# Patient Record
Sex: Female | Born: 1985
Health system: Southern US, Community
[De-identification: ages and names within clinical notes are randomized; demographics above are authoritative.]

## PROBLEM LIST (undated history)

## (undated) DIAGNOSIS — K589 Irritable bowel syndrome without diarrhea: Secondary | ICD-10-CM

## (undated) DIAGNOSIS — J45909 Unspecified asthma, uncomplicated: Secondary | ICD-10-CM

## (undated) HISTORY — DX: Unspecified asthma, uncomplicated: J45.909

## (undated) HISTORY — PX: MOUTH SURGERY: SHX715

## (undated) HISTORY — DX: Irritable bowel syndrome, unspecified: K58.9

---

## 2011-12-28 ENCOUNTER — Emergency Department: Payer: Self-pay | Admitting: Emergency Medicine

## 2011-12-28 LAB — COMPREHENSIVE METABOLIC PANEL WITH GFR
Albumin: 3.7 g/dL
Alkaline Phosphatase: 87 U/L
Anion Gap: 10
BUN: 10 mg/dL
Bilirubin,Total: 0.3 mg/dL
Calcium, Total: 8.9 mg/dL
Chloride: 109 mmol/L — ABNORMAL HIGH
Co2: 22 mmol/L
Creatinine: 0.73 mg/dL
EGFR (African American): >60
EGFR (Non-African Amer.): >60
Glucose: 98 mg/dL
Osmolality: 280
Potassium: 3.8 mmol/L
SGOT(AST): 42 U/L — ABNORMAL HIGH
SGPT (ALT): 24 U/L
Sodium: 141 mmol/L
Total Protein: 7.9 g/dL

## 2011-12-28 LAB — CBC
MCH: 32.1 pg (ref 26.0–34.0)
MCHC: 35 g/dL (ref 32.0–36.0)
Platelet: 301 10*3/uL (ref 150–440)
RBC: 4.37 10*6/uL (ref 3.80–5.20)
WBC: 12.5 10*3/uL — ABNORMAL HIGH (ref 3.6–11.0)

## 2011-12-28 LAB — URINALYSIS, COMPLETE
Bacteria: NONE SEEN
Bilirubin,UR: NEGATIVE
Blood: NEGATIVE
Glucose,UR: NEGATIVE mg/dL (ref 0–75)
Ph: 6 (ref 4.5–8.0)
Specific Gravity: 1.019 (ref 1.003–1.030)
Squamous Epithelial: 7
WBC UR: 2 /HPF (ref 0–5)

## 2011-12-28 LAB — PREGNANCY, URINE: Pregnancy Test, Urine: NEGATIVE m[IU]/mL

## 2012-10-16 ENCOUNTER — Encounter: Payer: Self-pay | Admitting: Family Medicine

## 2012-10-16 ENCOUNTER — Ambulatory Visit (INDEPENDENT_AMBULATORY_CARE_PROVIDER_SITE_OTHER): Payer: BC Managed Care – PPO | Admitting: Family Medicine

## 2012-10-16 VITALS — BP 118/94 | Temp 99.3°F | Ht 65.0 in | Wt 186.6 lb

## 2012-10-16 DIAGNOSIS — J309 Allergic rhinitis, unspecified: Secondary | ICD-10-CM

## 2012-10-16 DIAGNOSIS — K589 Irritable bowel syndrome without diarrhea: Secondary | ICD-10-CM | POA: Insufficient documentation

## 2012-10-16 DIAGNOSIS — J019 Acute sinusitis, unspecified: Secondary | ICD-10-CM

## 2012-10-16 MED ORDER — AMOXICILLIN 500 MG PO TABS: 500.0000 mg | ORAL_TABLET | Freq: Three times a day (TID) | ORAL | Status: DC

## 2012-10-16 MED ORDER — FLUTICASONE PROPIONATE 50 MCG/ACT NA SUSP: 2.0000 | Freq: Every day | NASAL | Status: DC

## 2012-10-16 NOTE — Progress Notes (Signed)
  Subjective:    Patient ID: Sherry Rogers, female    DOB: 09/05/1985, 27 y.o.   MRN: [id number]  Otalgia  There is pain in both ears. This is a new problem. The current episode started 1 to 4 weeks ago. The problem has been gradually worsening. Associated symptoms include coughing and headaches.   she has history irritable bowel she keeps it under control through diet she also has history of allergies occasionally uses Allegra    Review of Systems  HENT: Positive for ear pain.   Respiratory: Positive for cough.   Neurological: Positive for headaches.   patient does have a history of allergies. She denies any chest tightness pressure pain shortness breath vomiting diarrhea     Objective:   Physical Exam Eardrums normal sinus minimal tenderness nares boggy throat is normal neck supple lungs are clear hearts regular       Assessment & Plan:  #1 allergic rhinitis-over-the-counter allergy medicine daily Flonase 2 sprays each nostril daily #2 otalgia probably related to sinus pressure hopefully getting allergies doing better will relieve this #3 if progressive symptoms then get prescription for antibiotics filled

## 2012-11-27 ENCOUNTER — Ambulatory Visit: Payer: BC Managed Care – PPO | Admitting: Family Medicine

## 2012-11-27 ENCOUNTER — Telehealth: Payer: Self-pay | Admitting: Family Medicine

## 2012-11-27 MED ORDER — CIPROFLOXACIN HCL 250 MG PO TABS: 250.0000 mg | ORAL_TABLET | Freq: Two times a day (BID) | ORAL | Status: DC

## 2012-11-27 NOTE — Telephone Encounter (Signed)
Pt is home with husband who just had his appendix out on Wednesday. She feels she had a bladder infection but can't leave him. Can we call her in some antibiotics? [facility] in [place]   syptoms are: burning while urination, urgency but can't go, pressure, no fever present

## 2012-11-27 NOTE — Telephone Encounter (Signed)
cipro 250 bid five d 

## 2012-11-27 NOTE — Telephone Encounter (Signed)
Sent rx for cipro 250 bid five days to pharmacy. Left message on voicemail notifying patient.

## 2013-02-08 ENCOUNTER — Ambulatory Visit (INDEPENDENT_AMBULATORY_CARE_PROVIDER_SITE_OTHER): Payer: BC Managed Care – PPO | Admitting: Family Medicine

## 2013-02-08 ENCOUNTER — Encounter: Payer: Self-pay | Admitting: Family Medicine

## 2013-02-08 ENCOUNTER — Ambulatory Visit (HOSPITAL_COMMUNITY)
Admission: RE | Admit: 2013-02-08 | Discharge: 2013-02-08 | Disposition: A | Discharge status: Home / Self Care | Payer: BC Managed Care – PPO | Source: Ambulatory Visit | Attending: Family Medicine | Admitting: Family Medicine

## 2013-02-08 VITALS — BP 128/98 | Ht 65.0 in | Wt 176.0 lb

## 2013-02-08 DIAGNOSIS — M25579 Pain in unspecified ankle and joints of unspecified foot: Secondary | ICD-10-CM

## 2013-02-08 DIAGNOSIS — M25571 Pain in right ankle and joints of right foot: Secondary | ICD-10-CM

## 2013-02-08 MED ORDER — MELOXICAM 15 MG PO TABS: 15.0000 mg | ORAL_TABLET | Freq: Every day | ORAL | Status: DC

## 2013-02-08 NOTE — Progress Notes (Signed)
   Subjective:    Patient ID: Sherry Rogers, female    DOB: 16-Sep-1985, 27 y.o.   MRN: [id number]  HPI Comments: Larey Seat down stairs, 2 different times   Ankle Injury  The incident occurred more than 1 week ago. The incident occurred at home. The injury mechanism was a fall. The pain is present in the left ankle and right ankle. The quality of the pain is described as aching. The pain has been intermittent since onset. She reports no foreign bodies present. The symptoms are aggravated by movement and weight bearing. She has tried NSAIDs for the symptoms. The treatment provided mild relief.    Patient fell several months back worse on the right ankle than the left ankle PMH benign  Review of Systems No particular problems on today's discussion no chest pain shortness breath nausea vomiting    Objective:   Physical Exam Normal ankle moderate tenderness right side minimal left side fair range of motion knees are normal  Patient has lost 15 pounds through [employer]     Assessment & Plan:  Bilateral ankle pain worse on the right than the left related to previous falls-stretching exercises shown strengthening exercises shown in addition to this to do x-ray. Await the results

## 2013-03-23 ENCOUNTER — Ambulatory Visit (INDEPENDENT_AMBULATORY_CARE_PROVIDER_SITE_OTHER): Payer: BC Managed Care – PPO | Admitting: Family Medicine

## 2013-03-23 ENCOUNTER — Encounter: Payer: Self-pay | Admitting: Family Medicine

## 2013-03-23 VITALS — BP 138/82 | Temp 98.6°F | Ht 65.0 in | Wt 170.0 lb

## 2013-03-23 DIAGNOSIS — R300 Dysuria: Secondary | ICD-10-CM

## 2013-03-23 DIAGNOSIS — H9209 Otalgia, unspecified ear: Secondary | ICD-10-CM

## 2013-03-23 DIAGNOSIS — H9202 Otalgia, left ear: Secondary | ICD-10-CM

## 2013-03-23 LAB — POCT URINALYSIS DIPSTICK
Spec Grav, UA: 1.015
pH, UA: 5

## 2013-03-23 MED ORDER — CEFPROZIL 500 MG PO TABS: 500.0000 mg | ORAL_TABLET | Freq: Two times a day (BID) | ORAL | Status: DC

## 2013-03-23 NOTE — Progress Notes (Signed)
   Subjective:    Patient ID: Sherry Rogers, female    DOB: 09-Oct-1985, 28 y.o.   MRN: [id number]  HPI Patient arrives with urinary sx for few days- feels pressure to go but cant and painful urination. Denies fever sweats chills nausea diarrhea. Denies frequency of urinary tract infections Review of Systems    see above Objective:   Physical Exam  Lungs clear hearts regular abdomen soft UA with some wbc's Left ear appears normal may have some sinus issues     Assessment & Plan:  Dysuria - Plan: POCT urinalysis dipstick  Otalgia of left ear  Antibiotics prescribed followup if ongoing troubles

## 2013-06-22 ENCOUNTER — Ambulatory Visit: Payer: BC Managed Care – PPO | Admitting: Podiatry

## 2013-06-23 ENCOUNTER — Ambulatory Visit (INDEPENDENT_AMBULATORY_CARE_PROVIDER_SITE_OTHER): Payer: BC Managed Care – PPO

## 2013-06-23 ENCOUNTER — Telehealth: Payer: Self-pay | Admitting: *Deleted

## 2013-06-23 VITALS — BP 157/90 | HR 91 | Resp 17 | Ht 65.0 in | Wt 158.0 lb

## 2013-06-23 DIAGNOSIS — B351 Tinea unguium: Secondary | ICD-10-CM

## 2013-06-23 DIAGNOSIS — S90129A Contusion of unspecified lesser toe(s) without damage to nail, initial encounter: Secondary | ICD-10-CM

## 2013-06-23 MED ORDER — EFINACONAZOLE 10 % EX SOLN: CUTANEOUS | Status: DC

## 2013-06-23 NOTE — Patient Instructions (Signed)
Onychomycosis/Fungal Toenails  WHAT IS IT? An infection that lies within the keratin of your nail plate that is caused by a fungus.  WHY ME? Fungal infections affect all ages, sexes, races, and creeds.  There may be many factors that predispose you to a fungal infection such as age, coexisting medical conditions such as diabetes, or an autoimmune disease; stress, medications, fatigue, genetics, etc.  Bottom line: fungus thrives in a warm, moist environment and your shoes offer such a location.  IS IT CONTAGIOUS? Theoretically, yes.  You do not want to share shoes, nail clippers or files with someone who has fungal toenails.  Walking around barefoot in the same room or sleeping in the same bed is unlikely to transfer the organism.  It is important to realize, however, that fungus can spread easily from one nail to the next on the same foot.  HOW DO WE TREAT THIS?  There are several ways to treat this condition.  Treatment may depend on many factors such as age, medications, pregnancy, liver and kidney conditions, etc.  It is best to ask your doctor which options are available to you.  1. No treatment.   Unlike many other medical concerns, you can live with this condition.  However for many people this can be a painful condition and may lead to ingrown toenails or a bacterial infection.  It is recommended that you keep the nails cut short to help reduce the amount of fungal nail. 2. Topical treatment.  These range from herbal remedies to prescription strength nail lacquers.  About 40-50% effective, topicals require twice daily application for approximately 9 to 12 months or until an entirely new nail has grown out.  The most effective topicals are medical grade medications available through physicians offices. 3. Oral antifungal medications.  With an 80-90% cure rate, the most common oral medication requires 3 to 4 months of therapy and stays in your system for a year as the new nail grows out.  Oral  antifungal medications do require blood work to make sure it is a safe drug for you.  A liver function panel will be performed prior to starting the medication and after the first month of treatment.  It is important to have the blood work performed to avoid any harmful side effects.  In general, this medication safe but blood work is required. 4. Laser Therapy.  This treatment is performed by applying a specialized laser to the affected nail plate.  This therapy is noninvasive, fast, and non-painful.  It is not covered by insurance and is therefore, out of pocket.  The results have been very good with a 80-95% cure rate.  [facility] is the only practice in the area to offer this therapy. 5. Permanent Nail Avulsion.  Removing the entire nail so that a new nail will not grow back.  Apply prescribed topical nail antifungal once daily as instructed for 12 months to the affected  Reappointed 6 weeks for followup and possible initiation of oral antifungal medications

## 2013-06-23 NOTE — Progress Notes (Signed)
   Subjective:    Patient ID: Sherry Rogers, female    DOB: 1985/09/11, 28 y.o.   MRN: [id number]  HPI Comments: N toenail fungus L left 1st toenail D 12 years O dropped a desk in the toe in the 9th grade C thick, discolored, and tender A pressure, enclosed shoes T hx of surgical removal x2 and fell off once    And use of antifungal cream and gel    Review of Systems  Allergic/Immunologic: Positive for environmental allergies.  All other systems reviewed and are negative.      Objective:   Physical Exam Neurovascular status is intact pedal pulses palpable epicritic and proprioceptive sensations intact and symmetric bilateral. There is normal plantar response DTRs not elicited dermatologically skin color pigment and hair growth are normal the left hallux nail plate shows thickening restless discoloration distal two thirds of proximal one third is actually improved her clearing patient is treated in the past with topical treatments Lamisil topically cream and a gel likely if you're he a gel patient is also taking Lamisil tablets in the past however as never had complete resolution. Patient case they may have done culture however was likely a culture just a KOH or PAS stain and no definite identification been done as to whether this is a dermatophyte or nontender mass slight sacrifice or yeast.       Assessment & Plan:  Assessment history of contusion to left great toe onychodystrophy and onychomycosis. Plan at this time nails are debrided the hallux nails debrided and nail clippings were obtained at the proximal most point the affected site and submitted to make a labs for fungal culture KOH in PAS staining. We're looking to identify the type of fungus present at this time and patient reappointed in one month for possible additional treatment to the topical antifungal which was prescribed at this time. A prescription for [name] is 4 to the underlying pharmacy. Patient will posse also be  candidate for Lamisil or Sporanox in the future as well next progress Sherry Rogers DPM

## 2013-06-23 NOTE — Telephone Encounter (Signed)
Left [facility]ail fragments [facility] for definitive diagnosis of fungal elements.

## 2013-08-03 ENCOUNTER — Ambulatory Visit (INDEPENDENT_AMBULATORY_CARE_PROVIDER_SITE_OTHER): Payer: BC Managed Care – PPO

## 2013-08-03 VITALS — BP 128/78 | HR 75 | Resp 13 | Ht 65.0 in | Wt 154.0 lb

## 2013-08-03 DIAGNOSIS — S90129A Contusion of unspecified lesser toe(s) without damage to nail, initial encounter: Secondary | ICD-10-CM

## 2013-08-03 DIAGNOSIS — B351 Tinea unguium: Secondary | ICD-10-CM

## 2013-08-03 MED ORDER — TERBINAFINE HCL 250 MG PO TABS: 250.0000 mg | ORAL_TABLET | Freq: Every day | ORAL | Status: DC

## 2013-08-03 NOTE — Progress Notes (Signed)
   Subjective:    Patient ID: Sherry Rogers, female    DOB: Jan 14, 1986, 28 y.o.   MRN: [id number]  HPI Comments: Pt states she's been using the Jublea more like 5 weeks, and has seen some improvement in the appearance and the left 1st toenail is not as sore.     Review of Systems no new changes or findings noted     Objective:   Physical Exam Neurovascular status is intact pedal pulses palpable epicritic and proprioceptive sensations intact and unchanged patient's no cultures have come back there is definite growth of dermatophyte Trichophyton species of left hallux nail specimen there is also some overlying growth identified of a non-dermatophyte molds although not specifically identified. This may explain previous failed to improve his patient we've been treating with Lamisil however this time we use and Insall topical which will cover both coverage and used as well as incorporating the Lamisil treatment at this time.       Assessment & Plan:  Dennis temperature for Lamisil 50 mg once daily for 90 days is given 3 in one month supply with 2 refills patient has not had any difficulties with Lamisil the fascia far as GI upset or any hepatic or renal function issues indefinitely additional testing as needed systems is a 3 month regimen maintain the use a topical antifungal as well during the entire time an extra to 12 months as instructed reappointed 6 months for followup for nail check and reevaluation at this time patient requested some additional debridement of the thickened hypertrophic nail the nail plate is debridement treatment lumicain and Neosporin at this time the proximal nail shows posse some early signs of clearing the distal thickened dystrophic part should eventually, we returned away with prolonged growth recheck in 6 months as indicated  Alvan Dame DPM

## 2013-08-03 NOTE — Patient Instructions (Signed)
Onychomycosis/Fungal Toenails  WHAT IS IT? An infection that lies within the keratin of your nail plate that is caused by a fungus.  WHY ME? Fungal infections affect all ages, sexes, races, and creeds.  There may be many factors that predispose you to a fungal infection such as age, coexisting medical conditions such as diabetes, or an autoimmune disease; stress, medications, fatigue, genetics, etc.  Bottom line: fungus thrives in a warm, moist environment and your shoes offer such a location.  IS IT CONTAGIOUS? Theoretically, yes.  You do not want to share shoes, nail clippers or files with someone who has fungal toenails.  Walking around barefoot in the same room or sleeping in the same bed is unlikely to transfer the organism.  It is important to realize, however, that fungus can spread easily from one nail to the next on the same foot.  HOW DO WE TREAT THIS?  There are several ways to treat this condition.  Treatment may depend on many factors such as age, medications, pregnancy, liver and kidney conditions, etc.  It is best to ask your doctor which options are available to you.  1. No treatment.   Unlike many other medical concerns, you can live with this condition.  However for many people this can be a painful condition and may lead to ingrown toenails or a bacterial infection.  It is recommended that you keep the nails cut short to help reduce the amount of fungal nail. 2. Topical treatment.  These range from herbal remedies to prescription strength nail lacquers.  About 40-50% effective, topicals require twice daily application for approximately 9 to 12 months or until an entirely new nail has grown out.  The most effective topicals are medical grade medications available through physicians offices. 3. Oral antifungal medications.  With an 80-90% cure rate, the most common oral medication requires 3 to 4 months of therapy and stays in your system for a year as the new nail grows out.  Oral  antifungal medications do require blood work to make sure it is a safe drug for you.  A liver function panel will be performed prior to starting the medication and after the first month of treatment.  It is important to have the blood work performed to avoid any harmful side effects.  In general, this medication safe but blood work is required. 4. Laser Therapy.  This treatment is performed by applying a specialized laser to the affected nail plate.  This therapy is noninvasive, fast, and non-painful.  It is not covered by insurance and is therefore, out of pocket.  The results have been very good with a 80-95% cure rate.  [facility] is the only practice in the area to offer this therapy. 5. Permanent Nail Avulsion.  Removing the entire nail so that a new nail will not grow back.  Continue applying a topical Jubilee medication once daily to the affected nail for total of 12 months

## 2013-08-04 ENCOUNTER — Ambulatory Visit: Payer: BC Managed Care – PPO

## 2013-08-16 ENCOUNTER — Telehealth: Payer: Self-pay | Admitting: *Deleted

## 2013-08-16 NOTE — Telephone Encounter (Signed)
In there 2 weeks ago.  The doctor put me on Lamisil.  He said it would not convert with my birth control.  It was on the list that it could cause spotting.  It has.  I have decided I need to come off it.  Are there any alternatives?  Please give me a call back.

## 2013-08-17 NOTE — Telephone Encounter (Signed)
I called and informed her that Dr. Ralene CorkSikora said that the only other medication is Sporonox and it may have more side effects than the Lamisil.  She stated birth control was more important.  She said she would just stick with the Jublia.  She saids thanks for calling me back.

## 2013-11-05 ENCOUNTER — Encounter: Payer: Self-pay | Admitting: Family Medicine

## 2013-11-05 ENCOUNTER — Ambulatory Visit (INDEPENDENT_AMBULATORY_CARE_PROVIDER_SITE_OTHER): Payer: BC Managed Care – PPO | Admitting: Family Medicine

## 2013-11-05 VITALS — BP 132/80 | Temp 99.4°F | Ht 65.0 in | Wt 160.0 lb

## 2013-11-05 DIAGNOSIS — J3089 Other allergic rhinitis: Secondary | ICD-10-CM

## 2013-11-05 DIAGNOSIS — J302 Other seasonal allergic rhinitis: Secondary | ICD-10-CM

## 2013-11-05 DIAGNOSIS — H6502 Acute serous otitis media, left ear: Secondary | ICD-10-CM

## 2013-11-05 DIAGNOSIS — H6500 Acute serous otitis media, unspecified ear: Secondary | ICD-10-CM

## 2013-11-05 MED ORDER — CEFPROZIL 500 MG PO TABS: 500.0000 mg | ORAL_TABLET | Freq: Two times a day (BID) | ORAL | Status: DC

## 2013-11-05 NOTE — Progress Notes (Signed)
   Subjective:    Patient ID: Sherry Rogers, female    DOB: 1985-07-10, 28 y.o.   MRN: [id number]  Otalgia  There is pain in the left ear. The current episode started 1 to 4 weeks ago. Associated symptoms include coughing, headaches and hearing loss. Associated symptoms comments: Runny nose. Treatments tried: allegra D and flonase.   Allergies started it  Took allegra d and flonase   Took  Also heard  pao in the left ear  freq ear infxns  Hx of swimmers ear also   all   Review of Systems  HENT: Positive for ear pain and hearing loss.   Respiratory: Positive for cough.   Neurological: Positive for headaches.       Objective:   Physical Exam  Alert moderate malaise. Nasal congestion pharynx slight erythema neck supple left otitis media noted lungs clear heart regular in rhythm      Assessment & Plan:  Impression left otitis media #2 allergic rhinitis discussed plan Cefzil twice a day 10 days. Local measures discussed. Add Flonase as directed previously. WSL

## 2013-11-10 ENCOUNTER — Encounter: Payer: Self-pay | Admitting: Family Medicine

## 2013-11-10 ENCOUNTER — Telehealth: Payer: Self-pay | Admitting: Family Medicine

## 2013-11-10 ENCOUNTER — Ambulatory Visit (INDEPENDENT_AMBULATORY_CARE_PROVIDER_SITE_OTHER): Payer: BC Managed Care – PPO | Admitting: Family Medicine

## 2013-11-10 VITALS — BP 128/84 | Temp 99.2°F | Ht 65.0 in | Wt 161.0 lb

## 2013-11-10 DIAGNOSIS — J018 Other acute sinusitis: Secondary | ICD-10-CM

## 2013-11-10 MED ORDER — LEVOFLOXACIN 500 MG PO TABS: 500.0000 mg | ORAL_TABLET | Freq: Every day | ORAL | Status: DC

## 2013-11-10 NOTE — Telephone Encounter (Signed)
Pt is calling due to the fact that she was doing better but then  Woke this morning to her ear throbbing, feeling extremely nauseous Pt not sure if she needs another antibiotic or doe she need to be see again  cefprozil  was issued on 11/05/13  [facility] [place]

## 2013-11-10 NOTE — Progress Notes (Signed)
   Subjective:    Patient ID: Sherry Rogers, female    DOB: 12-31-1985, 28 y.o.   MRN: [id number]  HPISeen Friday by Dr. Brett Canales. Prescribed cefzil. Pt is still taking. Bilateral ear pain. Left is worse. Headache, Dizziness and nausea started today. Fever yesterday 99.9. Has tried allegra D, nasal spray, rest, and cefzil.   No history of tubes   Review of Systems  Constitutional: Negative for fever and activity change.  HENT: Positive for congestion and rhinorrhea. Negative for ear pain.   Eyes: Negative for discharge.  Respiratory: Positive for cough. Negative for shortness of breath and wheezing.   Cardiovascular: Negative for chest pain.   Hum in left ear/ sharp pain     Objective:   Physical Exam  Nursing note and vitals reviewed. Constitutional: She appears well-developed.  HENT:  Head: Normocephalic.  Nose: Nose normal.  Mouth/Throat: Oropharynx is clear and moist. No oropharyngeal exudate.  Neck: Neck supple.  Cardiovascular: Normal rate and normal heart sounds.   No murmur heard. Pulmonary/Chest: Effort normal and breath sounds normal. She has no wheezes.  Lymphadenopathy:    She has no cervical adenopathy.  Skin: Skin is warm and dry.    TMs  nl  Not toxic    Assessment & Plan:  Sinusitis Secondary ear pain Resolving otitis 2nd round antibiotic Fu if worse

## 2013-11-10 NOTE — Telephone Encounter (Signed)
Nurse's please call the patient discussed it with her. Although we can call in an additional antibiotic patient may want to be seen. I can see her at the end of the day. If she would rather try an additional antibiotic and medication for nausea we can send that in but if she is not turning the corner by Friday she would need to be seen. Call the patient met me know

## 2013-11-10 NOTE — Telephone Encounter (Signed)
Patient stated she would like to be re checked-follow up office visit scheduled today.

## 2013-12-29 ENCOUNTER — Ambulatory Visit: Payer: BC Managed Care – PPO | Admitting: Family Medicine

## 2014-02-02 ENCOUNTER — Encounter: Payer: Self-pay | Admitting: Podiatrist

## 2014-02-02 ENCOUNTER — Ambulatory Visit (INDEPENDENT_AMBULATORY_CARE_PROVIDER_SITE_OTHER): Payer: BC Managed Care – PPO | Admitting: Podiatrist

## 2014-02-02 ENCOUNTER — Ambulatory Visit: Payer: BC Managed Care – PPO

## 2014-02-02 VITALS — BP 126/84 | HR 86 | Resp 12

## 2014-02-02 DIAGNOSIS — B351 Tinea unguium: Secondary | ICD-10-CM

## 2014-02-14 NOTE — Progress Notes (Signed)
Chief Complaint  Patient presents with  . Nail Problem    ''LT FOOT GREAT TOENAIL IS GETTING THICKER AGAIN.''     HPI: Patient is 28 y.o. female who presents today for follow up of thickened toenail on the left great toe.  She relates she was taking the lamisil however it interacted with the birth control pills and so she stopped taking the lamisil.  She would like to know if there are any other treatement options available.   No Known Allergies  Physical Exam  Patient is awake, alert, and oriented x 3.  In no acute distress.  Vascular status is intact with palpable pedal pulses at 2/4 DP and PT bilateral and capillary refill time within normal limits. Neurological sensation is also intact bilaterally via Semmes Weinstein monofilament at 5/5 sites. Left hallux nail is thick, discolored and does appear to be clinically mycotic.  It has an area of discoloration at the distal portion of the nail that appears subungual.   Assessment: mycotic toenail  Plan: discussed treatment options.  i will check with Beth toland to see if Onmel has similar problems with birth control pills and call Corrie DandyMary to let her know.  In the mean time she will try topical antifungal.  If the oral does interefere with bcp's she would consider permenent removal of the toenail. i will call her when I have an answer for her.

## 2014-02-15 ENCOUNTER — Telehealth: Payer: Self-pay | Admitting: *Deleted

## 2014-02-15 NOTE — Telephone Encounter (Addendum)
Pt states Dr. Irving ShowsEgerton was going to check if Omnel affected pt's birth control pills.  Left message informing pt of Dr. Faylene MillionEgerton's statement that Onmel had the same effect on birth control pills as Lamisil, other options are toenail removal, laser or topical medications.

## 2014-02-16 NOTE — Telephone Encounter (Signed)
Yes she is correct- I called the drug rep and am waiting on her to call me back.  I think she is on vacation for christmas.  I checked with my pharmacist friend and he wasn't absolutely sure so I am double checking with the person who knows the most about that particular drug-- sorry for the delay!!  I will call her again next week if I have not heard anything by Monday or Tuesday.

## 2014-04-22 ENCOUNTER — Ambulatory Visit: Payer: BC Managed Care – PPO | Admitting: Family Medicine

## 2014-12-05 ENCOUNTER — Other Ambulatory Visit: Payer: Self-pay | Admitting: Obstetrics and Gynecology

## 2014-12-05 ENCOUNTER — Ambulatory Visit (HOSPITAL_COMMUNITY)
Admission: RE | Admit: 2014-12-05 | Discharge: 2014-12-05 | Disposition: A | Discharge status: Home / Self Care | Payer: BC Managed Care – PPO | Source: Ambulatory Visit | Attending: Obstetrics and Gynecology | Admitting: Obstetrics and Gynecology

## 2014-12-05 DIAGNOSIS — R1031 Right lower quadrant pain: Secondary | ICD-10-CM

## 2014-12-05 MED ORDER — IOHEXOL 300 MG/ML  SOLN
100.0000 mL | Freq: Once | INTRAMUSCULAR | Status: AC | PRN
  Administered 2014-12-05: 100 mL via INTRAVENOUS

## 2014-12-05 MED ORDER — IOHEXOL 300 MG/ML  SOLN
25.0000 mL | INTRAMUSCULAR | Status: AC
Start: 2014-12-05 — End: 2014-12-05
  Administered 2014-12-05: 25 mL via ORAL

## 2016-08-20 ENCOUNTER — Ambulatory Visit (INDEPENDENT_AMBULATORY_CARE_PROVIDER_SITE_OTHER): Payer: 59 | Admitting: Family Medicine

## 2016-08-20 ENCOUNTER — Encounter: Payer: Self-pay | Admitting: Family Medicine

## 2016-08-20 VITALS — BP 118/74 | Temp 98.6°F | Ht 66.0 in | Wt 187.0 lb

## 2016-08-20 DIAGNOSIS — B029 Zoster without complications: Secondary | ICD-10-CM | POA: Diagnosis not present

## 2016-08-20 MED ORDER — VALACYCLOVIR HCL 1 G PO TABS: 1000.0000 mg | ORAL_TABLET | Freq: Three times a day (TID) | ORAL | 0 refills | Status: DC

## 2016-08-20 NOTE — Patient Instructions (Signed)
Shingles Shingles, which is also known as herpes zoster, is an infection that causes a painful skin rash and fluid-filled blisters. Shingles is not related to genital herpes, which is a sexually transmitted infection. Shingles only develops in people who:  Have had chickenpox.  Have received the chickenpox vaccine. (This is rare.)  What are the causes? Shingles is caused by varicella-zoster virus (VZV). This is the same virus that causes chickenpox. After exposure to VZV, the virus stays in the body in an inactive (dormant) state. Shingles develops if the virus reactivates. This can happen many years after the initial exposure to VZV. It is not known what causes this virus to reactivate. What increases the risk? People who have had chickenpox or received the chickenpox vaccine are at risk for shingles. Infection is more common in people who:  Are older than age 50.  Have a weakened defense (immune) system, such as those with HIV, AIDS, or cancer.  Are taking medicines that weaken the immune system, such as transplant medicines.  Are under great stress.  What are the signs or symptoms? Early symptoms of this condition include itching, tingling, and pain in an area on your skin. Pain may be described as burning, stabbing, or throbbing. A few days or weeks after symptoms start, a painful red rash appears, usually on one side of the body in a bandlike or beltlike pattern. The rash eventually turns into fluid-filled blisters that break open, scab over, and dry up in about 2-3 weeks. At any time during the infection, you may also develop:  A fever.  Chills.  A headache.  An upset stomach.  How is this diagnosed? This condition is diagnosed with a skin exam. Sometimes, skin or fluid samples are taken from the blisters before a diagnosis is made. These samples are examined under a microscope or sent to a lab for testing. How is this treated? There is no specific cure for this condition.  Your health care provider will probably prescribe medicines to help you manage pain, recover more quickly, and avoid long-term problems. Medicines may include:  Antiviral drugs.  Anti-inflammatory drugs.  Pain medicines.  If the area involved is on your face, you may be referred to a specialist, such as an eye doctor (ophthalmologist) or an ear, nose, and throat (ENT) doctor to help you avoid eye problems, chronic pain, or disability. Follow these instructions at home: Medicines  Take medicines only as directed by your health care provider.  Apply an anti-itch or numbing cream to the affected area as directed by your health care provider. Blister and Rash Care  Take a cool bath or apply cool compresses to the area of the rash or blisters as directed by your health care provider. This may help with pain and itching.  Keep your rash covered with a loose bandage (dressing). Wear loose-fitting clothing to help ease the pain of material rubbing against the rash.  Keep your rash and blisters clean with mild soap and cool water or as directed by your health care provider.  Check your rash every day for signs of infection. These include redness, swelling, and pain that lasts or increases.  Do not pick your blisters.  Do not scratch your rash. General instructions  Rest as directed by your health care provider.  Keep all follow-up visits as directed by your health care provider. This is important.  Until your blisters scab over, your infection can cause chickenpox in people who have never had it or been vaccinated   against it. To prevent this from happening, avoid contact with other people, especially: ? Babies. ? Pregnant women. ? Children who have eczema. ? Elderly people who have transplants. ? People who have chronic illnesses, such as leukemia or AIDS. Contact a health care provider if:  Your pain is not relieved with prescribed medicines.  Your pain does not get better after  the rash heals.  Your rash looks infected. Signs of infection include redness, swelling, and pain that lasts or increases. Get help right away if:  The rash is on your face or nose.  You have facial pain, pain around your eye area, or loss of feeling on one side of your face.  You have ear pain or you have ringing in your ear.  You have loss of taste.  Your condition gets worse. This information is not intended to replace advice given to you by your health care provider. Make sure you discuss any questions you have with your health care provider. Document Released: 02/11/2005 Document Revised: 10/08/2015 Document Reviewed: 12/23/2013 Elsevier Interactive Patient Education  2017 Else[employer] Inc.  

## 2016-08-20 NOTE — Progress Notes (Signed)
   Subjective:    Patient ID: Sherry Rogers, female    DOB: [birth year], 31 y.o.   MRN: [id number]  Rash  This is a new problem. Episode onset: 4 days. Location: under right arm. The rash is characterized by redness, itchiness and pain. She was exposed to nothing. Treatments tried: cortizone 10, aloe, benadryl cream.   Patient relates this rash came up about a week ago she noticed it when doing a lot of work out for discomfort start off with couple months now progressed into a patch of bumps that burns and is painful no pus drainage   Review of Systems  Skin: Positive for rash.   Denies fever headache body aches sweats chills    Objective:   Physical Exam  Lungs clear heart regular  Has a rash consistent with shingles a small patch  half inch by 2-1/2 inches    Assessment & Plan:  Shingles Valtrex 1 g 3 times a day 7 days Warning signs regarding postherpetic pain discussed Vaccine not indicated currently may consider at age 30 definitely by age 30 Avoid small babies under one year of age and avoid those on chemotherapy If any, patients from call us

## 2016-12-05 ENCOUNTER — Encounter: Payer: Self-pay | Admitting: Family Medicine

## 2016-12-05 ENCOUNTER — Ambulatory Visit (INDEPENDENT_AMBULATORY_CARE_PROVIDER_SITE_OTHER): Payer: BC Managed Care – PPO | Admitting: Family Medicine

## 2016-12-05 VITALS — BP 122/80 | Ht 66.0 in | Wt 195.0 lb

## 2016-12-05 DIAGNOSIS — M25571 Pain in right ankle and joints of right foot: Secondary | ICD-10-CM

## 2016-12-05 DIAGNOSIS — R600 Localized edema: Secondary | ICD-10-CM | POA: Diagnosis not present

## 2016-12-05 DIAGNOSIS — R631 Polydipsia: Secondary | ICD-10-CM

## 2016-12-05 DIAGNOSIS — M25572 Pain in left ankle and joints of left foot: Secondary | ICD-10-CM

## 2016-12-05 LAB — POCT URINALYSIS DIPSTICK
Spec Grav, UA: 1.02 (ref 1.010–1.025)
pH, UA: 5 (ref 5.0–8.0)

## 2016-12-05 LAB — POCT GLUCOSE (DEVICE FOR HOME USE): POC Glucose: 89 mg/dl (ref 70–99)

## 2016-12-05 NOTE — Progress Notes (Signed)
   Subjective:    Patient ID: Sherry Rogers, female    DOB: [birth year], 31 y.o.   MRN: [id number]  HPIbilateral ankle swelling. Started about one week ago.  She noticed swelling in her lower ankles that occurs she denies excessive salt use states it's more so when she's been sitting first band of time. 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 Unable to do a standup desk. Denies any PND orthopnea. Denies any abdominal swelling area denies blood pressure issues.  Wants to be tested for diabetes. Has ate today. Stays thirty. Not losing weight. On weight watchers. Family history. Patient relates having difficult time losing weight does have family history of diabetes and family history obesity Results for orders placed or performed in visit on 12/05/16  POCT Glucose (Device for Home Use)  Result Value Ref Range   Glucose Fasting, POC  70 - 99 mg/dL   POC Glucose 89 70 - 99 mg/dl       Review of Systems  Constitutional: Negative for activity change, appetite change and fatigue.  HENT: Negative for congestion.   Respiratory: Negative for cough.   Cardiovascular: Negative for chest pain.  Gastrointestinal: Negative for abdominal pain.  Endocrine: Positive for polydipsia. Negative for polyphagia.  Skin: Negative for color change.  Neurological: Negative for weakness.  Psychiatric/Behavioral: Negative for confusion.       Objective:   Physical Exam  Constitutional: She appears well-developed and well-nourished. No distress.  HENT:  Head: Normocephalic and atraumatic.  Eyes: Right eye exhibits no discharge. Left eye exhibits no discharge.  Neck: No tracheal deviation present.  Cardiovascular: Normal rate, regular rhythm and normal heart sounds.   No murmur heard. Pulmonary/Chest: Effort normal and breath sounds normal. No respiratory distress. She has no wheezes. She has no rales.  Musculoskeletal: She exhibits no edema.    Lymphadenopathy:    She has no cervical adenopathy.  Neurological: She is alert. She exhibits normal muscle tone.  Skin: Skin is warm and dry. No erythema.  Psychiatric: Her behavior is normal.  Vitals reviewed.   I do not find any evidence of edema in her ankles there is absolutely no crackles in her lungs and the heart has a normal rhythm no murmur no gallop      Assessment & Plan:  Ankle edema-I don't find any evidence of fluid overload currently. I do believe it would be in the patient's best interest to be more mobile with her work if possible possibly a standup desk We will check lab work make sure there is not kidney dysfunction liver dysfunction Urine did not show any protein which is good May need knee-high surgical support stockings await lab work first  Mild obesity the importance of calorie restriction as well as good food choices and exercise discuss this could well be genetic but I believe the patient is having a good effort to try to lose weight  In addition to all of this she does have some intermittent ankle arthralgia probably related to Zumba but classes she is doing she will try to do various other exercises to lessen impact on her ankles ibuprofen when necessary if it persists referral to orthopedics and physical therapy I don't feel patient has underlying arthritic condition.

## 2016-12-11 LAB — HEPATIC FUNCTION PANEL
ALBUMIN: 4.2 g/dL (ref 3.5–5.5)
ALK PHOS: 77 IU/L (ref 39–117)
ALT: 11 IU/L (ref 0–32)
AST: 15 IU/L (ref 0–40)
BILIRUBIN, DIRECT: 0.08 mg/dL (ref 0.00–0.40)
Bilirubin Total: 0.3 mg/dL (ref 0.0–1.2)
TOTAL PROTEIN: 7.2 g/dL (ref 6.0–8.5)

## 2016-12-11 LAB — BASIC METABOLIC PANEL
BUN / CREAT RATIO: 11 (ref 9–23)
BUN: 7 mg/dL (ref 6–20)
CALCIUM: 9.1 mg/dL (ref 8.7–10.2)
CHLORIDE: 103 mmol/L (ref 96–106)
CO2: 21 mmol/L (ref 20–29)
Creatinine, Ser: 0.65 mg/dL (ref 0.57–1.00)
GFR calc non Af Amer: 120 mL/min/{1.73_m2} (ref >59)
GFR, EST AFRICAN AMERICAN: 138 mL/min/{1.73_m2} (ref >59)
GLUCOSE: 91 mg/dL (ref 65–99)
POTASSIUM: 4.3 mmol/L (ref 3.5–5.2)
Sodium: 140 mmol/L (ref 134–144)

## 2016-12-11 LAB — LIPID PANEL
Chol/HDL Ratio: 3.6 ratio (ref 0.0–4.4)
Cholesterol, Total: 178 mg/dL (ref 100–199)
HDL: 50 mg/dL (ref >39)
LDL Calculated: 100 mg/dL — ABNORMAL HIGH (ref 0–99)
Triglycerides: 140 mg/dL (ref 0–149)
VLDL Cholesterol Cal: 28 mg/dL (ref 5–40)

## 2016-12-11 LAB — SEDIMENTATION RATE: Sed Rate: 20 mm/hr (ref 0–32)

## 2017-08-04 ENCOUNTER — Ambulatory Visit: Payer: BC Managed Care – PPO | Admitting: Family Medicine

## 2017-10-16 DIAGNOSIS — Z6831 Body mass index (BMI) 31.0-31.9, adult: Secondary | ICD-10-CM | POA: Diagnosis not present

## 2017-10-16 DIAGNOSIS — Z3202 Encounter for pregnancy test, result negative: Secondary | ICD-10-CM | POA: Diagnosis not present

## 2017-10-16 DIAGNOSIS — Z01419 Encounter for gynecological examination (general) (routine) without abnormal findings: Secondary | ICD-10-CM | POA: Diagnosis not present

## 2017-12-05 ENCOUNTER — Ambulatory Visit (INDEPENDENT_AMBULATORY_CARE_PROVIDER_SITE_OTHER): Payer: BLUE CROSS/BLUE SHIELD | Admitting: Family Medicine

## 2017-12-05 ENCOUNTER — Encounter: Payer: Self-pay | Admitting: Family Medicine

## 2017-12-05 VITALS — BP 114/72 | Temp 99.5°F | Ht 66.0 in | Wt 185.0 lb

## 2017-12-05 DIAGNOSIS — J019 Acute sinusitis, unspecified: Secondary | ICD-10-CM | POA: Diagnosis not present

## 2017-12-05 DIAGNOSIS — F329 Major depressive disorder, single episode, unspecified: Secondary | ICD-10-CM | POA: Diagnosis not present

## 2017-12-05 DIAGNOSIS — F419 Anxiety disorder, unspecified: Secondary | ICD-10-CM

## 2017-12-05 MED ORDER — AMOXICILLIN 500 MG PO CAPS: 500.0000 mg | ORAL_CAPSULE | Freq: Three times a day (TID) | ORAL | 0 refills | Status: DC

## 2017-12-05 MED ORDER — SERTRALINE HCL 50 MG PO TABS: 50.0000 mg | ORAL_TABLET | Freq: Every day | ORAL | 2 refills | Status: DC

## 2017-12-05 NOTE — Progress Notes (Signed)
Subjective:    Patient ID: Sherry Rogers, female    DOB: 28-Aug-1985, 32 y.o.   MRN: [id number]  Sinusitis  This is a new problem. The current episode started more than 1 month ago (june ). Associated symptoms include congestion and coughing. Pertinent negatives include no ear pain or shortness of breath. (Sinus pressure, wheezing, ear pain, sore throat) Treatments tried: allegra d, flonase, mucinex dm.  She is a very nice patient  Therapist wanted pt to discuss anxiety with pcp. Not suicidal.  Patient has had depression symptoms at the way back to middle school Having significant trouble with anxiety depression sadness blueness denies being suicidal also a lot of anxiousness worry This patient relates that she has had depression symptoms 4 months  The patient relates a lot of sadness negative feelings She is interested in starting a family but she is also interested in getting her depression doing better She finds herself feeling down depressed Not suicidal She also finds herself having a lot of anxiety related symptoms She is doing [place] counseling She struggles with her symptoms and wonders if this is a sign she is just not trying hard enough or if this is a sign that she needs help or medication last Review of Systems  Constitutional: Negative for activity change and fever.  HENT: Positive for congestion and rhinorrhea. Negative for ear pain.   Eyes: Negative for discharge.  Respiratory: Positive for cough. Negative for shortness of breath and wheezing.   Cardiovascular: Negative for chest pain.       Objective:   Physical Exam  Constitutional: She appears well-developed.  HENT:  Head: Normocephalic.  Nose: Nose normal.  Mouth/Throat: Oropharynx is clear and moist. No oropharyngeal exudate.  Neck: Neck supple.  Cardiovascular: Normal rate and normal heart sounds.  No murmur heard. Pulmonary/Chest: Effort normal and breath sounds normal. She has no wheezes.    Lymphadenopathy:    She has no cervical adenopathy.  Skin: Skin is warm and dry.  Nursing note and vitals reviewed.         Assessment & Plan:  Anxiety and depression Significant time spent with patient discussing Serotonin reuptake inhibitor would be most helpful Patient is thinking about starting a family I told her her options are counseling or counseling and medications but not to get pregnant while on medicine she states that she would prefer to be on counseling and medication She will go ahead and continue the counseling that she is doing She will follow-up with [place] in several weeks and she will send [place] documentation/a note via my chart somewhere in the next couple weeks on how she is doing We did discuss the mechanism of action of how the medicine works plus also side effects I did discuss side effects with her including rare risk of suicidal ideation and worsening depression Also discussed how it is very common to have nausea in the first week PHQ 9 as well as GAD 7 show significant anxiety and depression but no suicidal ideation  Patient was seen today for upper respiratory illness. It is felt that the patient is dealing with sinusitis.  Antibiotics were prescribed today. Importance of compliance with medication was discussed.  Symptoms should gradually resolve over the course of the next several days. If high fevers, progressive illness, difficulty breathing, worsening condition or failure for symptoms to improve over the next several days then the patient is to follow-up.  If any emergent conditions the patient is to follow-up in the emergency department  otherwise to follow-up in the office.

## 2017-12-07 ENCOUNTER — Encounter: Payer: Self-pay | Admitting: Family Medicine

## 2017-12-16 ENCOUNTER — Encounter: Payer: Self-pay | Admitting: Family Medicine

## 2017-12-19 ENCOUNTER — Ambulatory Visit (INDEPENDENT_AMBULATORY_CARE_PROVIDER_SITE_OTHER): Payer: BLUE CROSS/BLUE SHIELD | Admitting: Family Medicine

## 2017-12-19 VITALS — BP 110/72 | Ht 66.0 in | Wt 180.0 lb

## 2017-12-19 DIAGNOSIS — F329 Major depressive disorder, single episode, unspecified: Secondary | ICD-10-CM | POA: Diagnosis not present

## 2017-12-19 DIAGNOSIS — F419 Anxiety disorder, unspecified: Secondary | ICD-10-CM

## 2017-12-19 NOTE — Progress Notes (Signed)
   Subjective:    Patient ID: Sherry Rogers, female    DOB: [birth year], 32 y.o.   MRN: [id number]  HPI  Patient arrives for a follow up on depression. Patient was started on Zoloft a few weeks ago and states she really cant tell a difference yet but the side effects have went away. Very nice patient Presents for follow-up regarding her depression Has not seen much improvement at this point Did have side effects with the medication but now the side effects are coming down She is feeling better about how things are going  Review of Systems     Objective:   Physical Exam Today's visit was to discuss her antidepressant and how it is doing so far  Patient denies being suicidal If any problems with worsening depression immediately follow-up here or ER or behavioral health     Assessment & Plan:  Depression Continue medication She is seeing a Librarian, academic She will give [place] feedback in a few weeks time how this current dose is doing she may need to go up on the dose She will follow-up within several months for recheck She does not want to be on the medication long-term because she is hoping to start her family by spring time

## 2017-12-28 ENCOUNTER — Other Ambulatory Visit: Payer: Self-pay | Admitting: Family Medicine

## 2018-01-02 ENCOUNTER — Encounter: Payer: Self-pay | Admitting: Family Medicine

## 2018-01-23 ENCOUNTER — Encounter: Payer: Self-pay | Admitting: Family Medicine

## 2018-03-18 ENCOUNTER — Encounter: Payer: Self-pay | Admitting: Family Medicine

## 2018-04-22 ENCOUNTER — Telehealth: Payer: Self-pay | Admitting: Family Medicine

## 2018-04-22 DIAGNOSIS — Z13220 Encounter for screening for lipoid disorders: Secondary | ICD-10-CM

## 2018-04-22 DIAGNOSIS — Z79899 Other long term (current) drug therapy: Secondary | ICD-10-CM

## 2018-04-22 NOTE — Telephone Encounter (Signed)
Pt has CPE scheduled for 3/19 and would like to have lab work done before appt.

## 2018-04-22 NOTE — Telephone Encounter (Signed)
Lipid, metabolic 7 

## 2018-04-22 NOTE — Telephone Encounter (Signed)
Last labs 12/10/2016  Bmet, lipid, hepatic, sed rate.

## 2018-04-22 NOTE — Telephone Encounter (Signed)
Labs ordered. Left message to r/c.

## 2018-04-22 NOTE — Telephone Encounter (Signed)
Patient is aware of all. 

## 2018-05-11 DIAGNOSIS — Z79899 Other long term (current) drug therapy: Secondary | ICD-10-CM | POA: Diagnosis not present

## 2018-05-11 DIAGNOSIS — Z13220 Encounter for screening for lipoid disorders: Secondary | ICD-10-CM | POA: Diagnosis not present

## 2018-05-12 LAB — LIPID PANEL
CHOL/HDL RATIO: 3.3 ratio (ref 0.0–4.4)
Cholesterol, Total: 171 mg/dL (ref 100–199)
HDL: 52 mg/dL (ref >39)
LDL CALC: 95 mg/dL (ref 0–99)
TRIGLYCERIDES: 122 mg/dL (ref 0–149)
VLDL CHOLESTEROL CAL: 24 mg/dL (ref 5–40)

## 2018-05-12 LAB — BASIC METABOLIC PANEL
BUN/Creatinine Ratio: 13 (ref 9–23)
BUN: 12 mg/dL (ref 6–20)
CO2: 23 mmol/L (ref 20–29)
Calcium: 9.2 mg/dL (ref 8.7–10.2)
Chloride: 100 mmol/L (ref 96–106)
Creatinine, Ser: 0.89 mg/dL (ref 0.57–1.00)
GFR calc Af Amer: 100 mL/min/{1.73_m2} (ref >59)
GFR calc non Af Amer: 87 mL/min/{1.73_m2} (ref >59)
Glucose: 65 mg/dL (ref 65–99)
Potassium: 4.1 mmol/L (ref 3.5–5.2)
Sodium: 140 mmol/L (ref 134–144)

## 2018-05-14 ENCOUNTER — Other Ambulatory Visit: Payer: Self-pay

## 2018-05-14 ENCOUNTER — Ambulatory Visit (INDEPENDENT_AMBULATORY_CARE_PROVIDER_SITE_OTHER): Payer: BLUE CROSS/BLUE SHIELD | Admitting: Family Medicine

## 2018-05-14 ENCOUNTER — Encounter: Payer: Self-pay | Admitting: Family Medicine

## 2018-05-14 VITALS — BP 122/76 | Ht 65.5 in | Wt 168.0 lb

## 2018-05-14 DIAGNOSIS — Z0000 Encounter for general adult medical examination without abnormal findings: Secondary | ICD-10-CM | POA: Diagnosis not present

## 2018-05-14 MED ORDER — SERTRALINE HCL 50 MG PO TABS: 50.0000 mg | ORAL_TABLET | Freq: Every day | ORAL | 3 refills | Status: DC

## 2018-05-14 MED ORDER — ALBUTEROL SULFATE HFA 108 (90 BASE) MCG/ACT IN AERS: 2.0000 | INHALATION_SPRAY | Freq: Four times a day (QID) | RESPIRATORY_TRACT | 2 refills | Status: AC | PRN

## 2018-05-14 NOTE — Progress Notes (Signed)
   Subjective:    Patient ID: Sherry Rogers, female    DOB: 24-Mar-1985, 33 y.o.   MRN: [id number]  HPI  The patient comes in today for a wellness visit.    A review of their health history was completed.  A review of medications was also completed.  Any needed refills; zoloft  Eating habits: eating good  Falls/  MVA accidents in past few months: none  Regular exercise: get regular exercise  Specialist pt sees on regular basis: GYN for female exam  Preventative health issues were discussed.   Additional concerns:  Pt has female exam done at GYN office. Pt did have a banana before having blood work completed on 05/11/2018. Pt would like to know if she could stay on Zoloft.    Review of Systems  Constitutional: Negative for activity change, appetite change and fatigue.  HENT: Negative for congestion and rhinorrhea.   Respiratory: Negative for cough and shortness of breath.   Cardiovascular: Negative for chest pain and leg swelling.  Gastrointestinal: Negative for abdominal pain and diarrhea.  Endocrine: Negative for polydipsia and polyphagia.  Skin: Negative for color change.  Neurological: Negative for dizziness and weakness.  Psychiatric/Behavioral: Negative for behavioral problems and confusion.       Objective:   Physical Exam Vitals signs reviewed.  Constitutional:      General: She is not in acute distress. HENT:     Head: Normocephalic and atraumatic.  Eyes:     General:        Right eye: No discharge.        Left eye: No discharge.  Neck:     Trachea: No tracheal deviation.  Cardiovascular:     Rate and Rhythm: Normal rate and regular rhythm.     Heart sounds: Normal heart sounds. No murmur.  Pulmonary:     Effort: Pulmonary effort is normal. No respiratory distress.     Breath sounds: Normal breath sounds.  Lymphadenopathy:     Cervical: No cervical adenopathy.  Skin:    General: Skin is warm and dry.  Neurological:     Mental Status: She is  alert.     Coordination: Coordination normal.  Psychiatric:        Behavior: Behavior normal.     Gets gynecologic exam through her gynecologist  She states Zoloft is helping her she would like to continue the medication.  She states she has no plans on getting pregnant anytime in the future patient was counseled as long as she is stable on the medicine we will see her back in 1 year regarding this    Assessment & Plan:  Adult wellness-complete.wellness physical was conducted today. Importance of diet and exercise were discussed in detail.  In addition to this a discussion regarding safety was also covered. We also reviewed over immunizations and gave recommendations regarding current immunization needed for age.  In addition to this additional areas were also touched on including: Preventative health exams needed:  Colonoscopy not indicated  Patient was advised yearly wellness exam

## 2018-05-16 ENCOUNTER — Encounter: Payer: Self-pay | Admitting: Family Medicine

## 2018-05-17 ENCOUNTER — Encounter: Payer: Self-pay | Admitting: Family Medicine

## 2018-05-18 NOTE — Telephone Encounter (Signed)
Patient is aware of all. 

## 2018-05-18 NOTE — Telephone Encounter (Signed)
This message is a duplicate. Please see other note dated 05/18/2018.

## 2018-05-18 NOTE — Telephone Encounter (Signed)
I agree with nursing assessment.  If any ongoing trouble patient can notify us

## 2018-05-18 NOTE — Telephone Encounter (Signed)
I Spoke with the patient she states she went for a run on Friday outside and thinks she was affected by the pollen. She had developed a cough and had taken her inhaler and since feels better. She states she is not running a fever,or shortness of breath or cough. She no longer thinks she needs our assistance. I advised that if she develops a fever,shortness of breath or cough to please let us know .  Per Patient I basically have not left the apartment since yesterday morning (when I ran in the park) and have not coughed since. It must have been my allergies and I panicked. My apologies.     Thanks!

## 2018-09-29 ENCOUNTER — Encounter: Payer: Self-pay | Admitting: Family Medicine

## 2018-09-30 NOTE — Telephone Encounter (Signed)
I am certainly open to him potentially adjusting medication.  I believe that this deserves a free-flowing discussion back-and-forth to most adequately help her situation  Therefore I would recommend a virtual visit to discuss further Nurses-please communicate with patient see what we can do possibly getting her in this week or at the start of next week

## 2018-09-30 NOTE — Telephone Encounter (Signed)
Left message to return call 

## 2018-09-30 NOTE — Telephone Encounter (Signed)
Patient scheduled virtual visit with Dr Nicki Reaper to discuss.

## 2018-10-05 ENCOUNTER — Other Ambulatory Visit: Payer: Self-pay

## 2018-10-05 ENCOUNTER — Ambulatory Visit (INDEPENDENT_AMBULATORY_CARE_PROVIDER_SITE_OTHER): Payer: BC Managed Care – PPO | Admitting: Family Medicine

## 2018-10-05 DIAGNOSIS — F32A Depression, unspecified: Secondary | ICD-10-CM

## 2018-10-05 DIAGNOSIS — F329 Major depressive disorder, single episode, unspecified: Secondary | ICD-10-CM | POA: Diagnosis not present

## 2018-10-05 DIAGNOSIS — F419 Anxiety disorder, unspecified: Secondary | ICD-10-CM

## 2018-10-05 MED ORDER — SERTRALINE HCL 100 MG PO TABS: 100.0000 mg | ORAL_TABLET | Freq: Every day | ORAL | 0 refills | Status: DC

## 2018-10-05 NOTE — Progress Notes (Signed)
   Subjective:    Patient ID: Sherry Rogers, female    DOB: 06-21-1985, 33 y.o.   MRN: [id number]  HPI  Patient calls to discuss increasing Zoloft dosage. Patient stated in my chart message: The last few months have been difficult. She started having more bad days and thought it would go away if she could wait out this pandemic. However she is having many more bad days than she did at the beginning of the year and it's becoming disruptive in life. More than weekly  the patient just wants to lay in bed and do absolutely nothing.She has started stress eating again, and recently got stress hives from anxiety.  The patient is still meeting with the counselor but thinks she needs an increase in her Zoloft dosage. We discussed this at length I agree with the patient that if increased dose would help her it takes 3 to 4 weeks to see improvement she is not suicidal Virtual Visit via Video Note  I connected with Sherry Rogers on 10/05/18 at  9:30 AM EDT by a video enabled telemedicine application and verified that I am speaking with the correct person using two identifiers.  Location: Patient: home Provider: office   I discussed the limitations of evaluation and management by telemedicine and the availability of in person appointments. The patient expressed understanding and agreed to proceed.  History of Present Illness:    Observations/Objective:   Assessment and Plan:   Follow Up Instructions:    I discussed the assessment and treatment plan with the patient. The patient was provided an opportunity to ask questions and all were answered. The patient agreed with the plan and demonstrated an understanding of the instructions.   The patient was advised to call back or seek an in-person evaluation if the symptoms worsen or if the condition fails to improve as anticipated.  I provided 16 minutes of non-face-to-face time during this encounter.      Review of Systems   Constitutional: Negative for activity change and appetite change.  HENT: Negative for congestion and rhinorrhea.   Respiratory: Negative for cough and shortness of breath.   Cardiovascular: Negative for chest pain and leg swelling.  Gastrointestinal: Negative for abdominal pain, nausea and vomiting.  Skin: Negative for color change.  Neurological: Negative for dizziness and weakness.  Psychiatric/Behavioral: Negative for agitation and confusion.       Objective:   Physical Exam  Patient had virtual visit Appears to be in no distress Atraumatic Neuro able to relate and oriented No apparent resp distress Color normal       Assessment & Plan:  Depression with anxiety Not suicidal Will increase the dose to 100 mg Patient will give [place] follow-up in 3 to 4 weeks Follow-up office visit 3 to 4 months Follow-up sooner problems Warning signs were discussed I talked with the patient whether she would want to do video follow-up in 3 to 4 weeks or for her to send [place] a updates she is preferring at this moment to do an update with possibility of a video visit Patient was told if she gets worse to immediately connect with [place]

## 2018-10-26 ENCOUNTER — Encounter: Payer: Self-pay | Admitting: Family Medicine

## 2018-10-28 DIAGNOSIS — N946 Dysmenorrhea, unspecified: Secondary | ICD-10-CM | POA: Diagnosis not present

## 2018-10-28 DIAGNOSIS — Z01419 Encounter for gynecological examination (general) (routine) without abnormal findings: Secondary | ICD-10-CM | POA: Diagnosis not present

## 2018-10-28 DIAGNOSIS — Z6827 Body mass index (BMI) 27.0-27.9, adult: Secondary | ICD-10-CM | POA: Diagnosis not present

## 2018-11-04 ENCOUNTER — Encounter: Payer: Self-pay | Admitting: Family Medicine

## 2018-11-09 ENCOUNTER — Other Ambulatory Visit: Payer: Self-pay | Admitting: Family Medicine

## 2018-11-09 MED ORDER — SERTRALINE HCL 100 MG PO TABS: 100.0000 mg | ORAL_TABLET | Freq: Every day | ORAL | 1 refills | Status: DC

## 2018-11-17 DIAGNOSIS — Z6826 Body mass index (BMI) 26.0-26.9, adult: Secondary | ICD-10-CM | POA: Diagnosis not present

## 2018-11-17 DIAGNOSIS — Z20828 Contact with and (suspected) exposure to other viral communicable diseases: Secondary | ICD-10-CM | POA: Diagnosis not present

## 2019-01-03 DIAGNOSIS — R05 Cough: Secondary | ICD-10-CM | POA: Diagnosis not present

## 2019-01-03 DIAGNOSIS — R5383 Other fatigue: Secondary | ICD-10-CM | POA: Diagnosis not present

## 2019-01-08 ENCOUNTER — Other Ambulatory Visit: Payer: Self-pay | Admitting: Family Medicine

## 2019-05-31 ENCOUNTER — Encounter: Payer: Self-pay | Admitting: Family Medicine

## 2019-06-07 ENCOUNTER — Other Ambulatory Visit: Payer: Self-pay | Admitting: Family Medicine

## 2019-06-07 NOTE — Telephone Encounter (Signed)
90-day recommend follow-up in the summer

## 2019-06-07 NOTE — Telephone Encounter (Signed)
Sorry dr Marina Goodell

## 2019-06-08 ENCOUNTER — Encounter: Payer: Self-pay | Admitting: Family Medicine

## 2019-09-05 ENCOUNTER — Other Ambulatory Visit: Payer: Self-pay | Admitting: Family Medicine

## 2019-09-07 NOTE — Telephone Encounter (Signed)
Verify with patient she is still taking May have 90-day May schedule follow-up office visit

## 2019-09-07 NOTE — Telephone Encounter (Signed)
Duplicate

## 2019-09-07 NOTE — Telephone Encounter (Signed)
Seen for this med 10/05/18

## 2019-09-08 ENCOUNTER — Other Ambulatory Visit: Payer: Self-pay | Admitting: *Deleted

## 2019-09-08 ENCOUNTER — Telehealth: Payer: Self-pay | Admitting: Family Medicine

## 2019-09-08 MED ORDER — SERTRALINE HCL 100 MG PO TABS: 100.0000 mg | ORAL_TABLET | Freq: Every day | ORAL | 0 refills | Status: DC

## 2019-09-08 NOTE — Telephone Encounter (Signed)
Patient is requesting refill sertraline 100 mg and she has appointment for medication follow up on 8/17. [phone] in [facility] [place]. Please advise

## 2019-09-08 NOTE — Telephone Encounter (Signed)
May have 90-day 

## 2019-09-08 NOTE — Telephone Encounter (Signed)
Refill sent and pt notified. 

## 2019-09-08 NOTE — Telephone Encounter (Signed)
Please advise. Thank you

## 2019-10-12 ENCOUNTER — Encounter: Payer: Self-pay | Admitting: Family Medicine

## 2019-10-12 ENCOUNTER — Other Ambulatory Visit: Payer: Self-pay

## 2019-10-12 ENCOUNTER — Ambulatory Visit (INDEPENDENT_AMBULATORY_CARE_PROVIDER_SITE_OTHER): Payer: BC Managed Care – PPO | Admitting: Family Medicine

## 2019-10-12 VITALS — BP 128/72 | Temp 97.5°F | Wt 202.2 lb

## 2019-10-12 DIAGNOSIS — F329 Major depressive disorder, single episode, unspecified: Secondary | ICD-10-CM

## 2019-10-12 DIAGNOSIS — F32A Depression, unspecified: Secondary | ICD-10-CM

## 2019-10-12 DIAGNOSIS — Z0000 Encounter for general adult medical examination without abnormal findings: Secondary | ICD-10-CM

## 2019-10-12 DIAGNOSIS — F419 Anxiety disorder, unspecified: Secondary | ICD-10-CM

## 2019-10-12 HISTORY — DX: Depression, unspecified: F32.A

## 2019-10-12 HISTORY — DX: Major depressive disorder, single episode, unspecified: F32.9

## 2019-10-12 HISTORY — DX: Anxiety disorder, unspecified: F41.9

## 2019-10-12 MED ORDER — SERTRALINE HCL 100 MG PO TABS: 100.0000 mg | ORAL_TABLET | Freq: Every day | ORAL | 1 refills | Status: DC

## 2019-10-12 NOTE — Progress Notes (Signed)
Subjective:    Patient ID: Sherry Rogers, female    DOB: [birth year], 34 y.o.   MRN: [id number]  HPI Pt here for medication follow up. Pt taking Zoloft 100 mg. Pt states she has no sex drive at all. Pt is going through a separation also.   The patient comes in today for a wellness visit.    A review of their health history was completed.  A review of medications was also completed.  Any needed refills; she does need refills  Eating habits: States that she is trying to eat healthy avoiding binging  Falls/  MVA accidents in past few months: Denies any accidents or injuries uses her seatbelts  Regular exercise: Does do some walking  Specialist pt sees on regular basis: She is seeing a counselor on a regular basis  Preventative health issues were discussed.   Additional concerns: Dealing with anxiety and depression going through separation currently, difficult on her but denies any self-harm  Review of Systems  Constitutional: Negative for activity change, appetite change and fatigue.  HENT: Negative for congestion and rhinorrhea.   Eyes: Negative for discharge.  Respiratory: Negative for cough, chest tightness and wheezing.   Cardiovascular: Negative for chest pain.  Gastrointestinal: Negative for abdominal pain, blood in stool and vomiting.  Endocrine: Negative for polyphagia.  Genitourinary: Negative for difficulty urinating and frequency.  Musculoskeletal: Negative for neck pain.  Skin: Negative for color change.  Allergic/Immunologic: Negative for environmental allergies and food allergies.  Neurological: Negative for weakness and headaches.  Psychiatric/Behavioral: Negative for agitation and behavioral problems.       Objective:   Physical Exam Constitutional:      Appearance: She is well-developed.  HENT:     Head: Normocephalic.     Right Ear: External ear normal.     Left Ear: External ear normal.  Eyes:     Pupils: Pupils are equal, round, and reactive  to light.  Neck:     Thyroid: No thyromegaly.  Cardiovascular:     Rate and Rhythm: Normal rate and regular rhythm.     Heart sounds: Normal heart sounds. No murmur heard.   Pulmonary:     Effort: Pulmonary effort is normal. No respiratory distress.     Breath sounds: Normal breath sounds. No wheezing.  Abdominal:     General: Bowel sounds are normal. There is no distension.     Palpations: Abdomen is soft. There is no mass.     Tenderness: There is no abdominal tenderness.  Musculoskeletal:        General: No tenderness. Normal range of motion.     Cervical back: Normal range of motion.  Lymphadenopathy:     Cervical: No cervical adenopathy.  Skin:    General: Skin is warm and dry.  Neurological:     Mental Status: She is alert and oriented to person, place, and time.     Motor: No abnormal muscle tone.  Psychiatric:        Behavior: Behavior normal.           Assessment & Plan:  Adult wellness-complete.wellness physical was conducted today. Importance of diet and exercise were discussed in detail.  In addition to this a discussion regarding safety was also covered. We also reviewed over immunizations and gave recommendations regarding current immunization needed for age.  In addition to this additional areas were also touched on including: Preventative health exams needed:  Colonoscopy not indicated currently  Patient was advised yearly wellness exam  Female wellness exam with her gynecologist every 3 years recommended  Lab work can be deferred till next year last years overall looked good  Patient doing well for anxiety and depression with her sertraline.  She would like to stick with this dose.  She is going through counseling.  She feels that her marriage will end up in divorce or separation.  She is trying to get things better but so far things are going to well  Renewal of medication given I recommend that she follow-up within approximately 6 months then at that  point time we can decide if it is the appropriate time to taper off the medicine based on a shared decision at that time

## 2019-11-22 ENCOUNTER — Encounter: Payer: Self-pay | Admitting: Family Medicine

## 2019-11-22 NOTE — Telephone Encounter (Signed)
Nurses Many places are now getting very particular with their exemption clauses. I cannot guarantee that this patient will qualify for an exemption. I certainly understand her concern. This specific symptom request from her employer is going beyond my level of expertise. It should be noted that many individuals who have a history of allergies have done well with the vaccine but that is not a guarantee that she will do well with the vaccine.  1 option would be to refer her to an allergist who can do further evaluation then write a letter based upon their findings but once again that may or may not qualify as an exemption depending on the level of her allergy  (So therefore referral to an allergist at this point would be reasonable)

## 2019-11-26 ENCOUNTER — Encounter: Payer: Self-pay | Admitting: Family Medicine

## 2019-11-26 DIAGNOSIS — Z6833 Body mass index (BMI) 33.0-33.9, adult: Secondary | ICD-10-CM | POA: Diagnosis not present

## 2019-11-26 DIAGNOSIS — N760 Acute vaginitis: Secondary | ICD-10-CM | POA: Diagnosis not present

## 2019-11-26 DIAGNOSIS — Z3040 Encounter for surveillance of contraceptives, unspecified: Secondary | ICD-10-CM | POA: Diagnosis not present

## 2019-11-26 DIAGNOSIS — Z01419 Encounter for gynecological examination (general) (routine) without abnormal findings: Secondary | ICD-10-CM | POA: Diagnosis not present

## 2019-11-26 DIAGNOSIS — Z113 Encounter for screening for infections with a predominantly sexual mode of transmission: Secondary | ICD-10-CM | POA: Diagnosis not present

## 2019-11-26 NOTE — Telephone Encounter (Signed)
A letter dictation was completed This is available to the patient via MyChart feel free to print and mail to the patient if she so request thank you  If she should need anything else to please connect with [place].  Nurses Please go ahead with providing her information regarding Dr. Malachi Bonds allergist If she should need our help with referral she can let us know and we will certainly do so.  Thanks-Dr. Lorin Picket

## 2020-01-14 ENCOUNTER — Encounter (HOSPITAL_COMMUNITY): Payer: Self-pay

## 2020-01-14 ENCOUNTER — Emergency Department (HOSPITAL_COMMUNITY): Payer: BC Managed Care – PPO

## 2020-01-14 ENCOUNTER — Ambulatory Visit (HOSPITAL_COMMUNITY)
Admission: EM | Admit: 2020-01-14 | Discharge: 2020-01-14 | Disposition: A | Discharge status: Home / Self Care | Payer: BC Managed Care – PPO | Attending: Family Medicine | Admitting: Family Medicine

## 2020-01-14 ENCOUNTER — Other Ambulatory Visit: Payer: Self-pay

## 2020-01-14 ENCOUNTER — Encounter (HOSPITAL_COMMUNITY): Payer: Self-pay | Admitting: Emergency Medicine

## 2020-01-14 ENCOUNTER — Emergency Department (HOSPITAL_COMMUNITY)
Admission: EM | Admit: 2020-01-14 | Discharge: 2020-01-14 | Disposition: A | Discharge status: Home / Self Care | Payer: BC Managed Care – PPO | Attending: Emergency Medicine | Admitting: Emergency Medicine

## 2020-01-14 DIAGNOSIS — Z79899 Other long term (current) drug therapy: Secondary | ICD-10-CM | POA: Insufficient documentation

## 2020-01-14 DIAGNOSIS — J45909 Unspecified asthma, uncomplicated: Secondary | ICD-10-CM | POA: Insufficient documentation

## 2020-01-14 DIAGNOSIS — N83201 Unspecified ovarian cyst, right side: Secondary | ICD-10-CM

## 2020-01-14 DIAGNOSIS — N83291 Other ovarian cyst, right side: Secondary | ICD-10-CM | POA: Diagnosis not present

## 2020-01-14 DIAGNOSIS — R109 Unspecified abdominal pain: Secondary | ICD-10-CM

## 2020-01-14 DIAGNOSIS — R102 Pelvic and perineal pain: Secondary | ICD-10-CM | POA: Diagnosis not present

## 2020-01-14 DIAGNOSIS — R110 Nausea: Secondary | ICD-10-CM | POA: Diagnosis not present

## 2020-01-14 DIAGNOSIS — N888 Other specified noninflammatory disorders of cervix uteri: Secondary | ICD-10-CM | POA: Diagnosis not present

## 2020-01-14 LAB — CBC
HCT: 41.2 % (ref 36.0–46.0)
Hemoglobin: 13.9 g/dL (ref 12.0–15.0)
MCH: 31.8 pg (ref 26.0–34.0)
MCHC: 33.7 g/dL (ref 30.0–36.0)
MCV: 94.3 fL (ref 80.0–100.0)
Platelets: 343 10*3/uL (ref 150–400)
RBC: 4.37 MIL/uL (ref 3.87–5.11)
RDW: 12.6 % (ref 11.5–15.5)
WBC: 9.9 10*3/uL (ref 4.0–10.5)
nRBC: 0 % (ref 0.0–0.2)

## 2020-01-14 LAB — COMPREHENSIVE METABOLIC PANEL
ALT: 13 U/L (ref 0–44)
AST: 29 U/L (ref 15–41)
Albumin: 3.6 g/dL (ref 3.5–5.0)
Alkaline Phosphatase: 68 U/L (ref 38–126)
Anion gap: 11 (ref 5–15)
BUN: 9 mg/dL (ref 6–20)
CO2: 20 mmol/L — ABNORMAL LOW (ref 22–32)
Calcium: 9.1 mg/dL (ref 8.9–10.3)
Chloride: 107 mmol/L (ref 98–111)
Creatinine, Ser: 0.71 mg/dL (ref 0.44–1.00)
GFR, Estimated: >60 mL/min (ref >60)
Glucose, Bld: 86 mg/dL (ref 70–99)
Potassium: 4.8 mmol/L (ref 3.5–5.1)
Sodium: 138 mmol/L (ref 135–145)
Total Bilirubin: 1.3 mg/dL — ABNORMAL HIGH (ref 0.3–1.2)
Total Protein: 6.9 g/dL (ref 6.5–8.1)

## 2020-01-14 LAB — POCT URINALYSIS DIPSTICK, ED / UC
Bilirubin Urine: NEGATIVE
Glucose, UA: NEGATIVE mg/dL
Hgb urine dipstick: NEGATIVE
Ketones, ur: NEGATIVE mg/dL
Nitrite: NEGATIVE
Protein, ur: NEGATIVE mg/dL
Specific Gravity, Urine: 1.02 (ref 1.005–1.030)
Urobilinogen, UA: 0.2 mg/dL (ref 0.0–1.0)
pH: 7 (ref 5.0–8.0)

## 2020-01-14 LAB — WET PREP, GENITAL
Clue Cells Wet Prep HPF POC: NONE SEEN
Sperm: NONE SEEN
Trich, Wet Prep: NONE SEEN
Yeast Wet Prep HPF POC: NONE SEEN

## 2020-01-14 LAB — I-STAT BETA HCG BLOOD, ED (MC, WL, AP ONLY): I-stat hCG, quantitative: <5 m[IU]/mL (ref <5)

## 2020-01-14 LAB — LIPASE, BLOOD: Lipase: 39 U/L (ref 11–51)

## 2020-01-14 MED ORDER — IBUPROFEN 600 MG PO TABS: 600.0000 mg | ORAL_TABLET | Freq: Four times a day (QID) | ORAL | 0 refills | Status: DC | PRN

## 2020-01-14 MED ORDER — KETOROLAC TROMETHAMINE 60 MG/2ML IM SOLN
60.0000 mg | Freq: Once | INTRAMUSCULAR | Status: AC
  Administered 2020-01-14: 60 mg via INTRAMUSCULAR

## 2020-01-14 MED ORDER — KETOROLAC TROMETHAMINE 60 MG/2ML IM SOLN
INTRAMUSCULAR | Status: AC
  Filled 2020-01-14: qty 2

## 2020-01-14 NOTE — ED Provider Notes (Signed)
[facility] EMERGENCY DEPARTMENT Provider Note   CSN: [id number] Arrival date & time: 01/14/20  1229     History Chief Complaint  Patient presents with   Abdominal Pain    Sherry Rogers is a 34 y.o. female.  The history is provided by the patient and medical records. No language interpreter was used.  Abdominal Pain    34 year old female significant history of IBS, asthma, anxiety and depression presenting for evaluation of abdominal and pelvic pain.  Patient report this morning she developed acute onset of severe pain to the suprapubic region.  Pain is intense, sharp, worse with palpation or with movement and mildly improved with passing gas.  Pain prompted her to go to urgent care for evaluation.  After being evaluated at urgent care patient did receive a shot of Toradol IM medication which did provide moderate relief of her discomfort but her pain has not fully resolved.  She denies any associated fever chills no nausea vomiting or diarrhea no dysuria vaginal bleeding or vaginal discharge.  States that she was tested for STIs several months ago and was negative and states she is currently not sexually active.  No history of kidney stone.  No provocative factors identified.  Urgent care center to send patient to the ED for further evaluation.  Past Medical History:  Diagnosis Date   Anxiety and depression 10/12/2019   On medication and counseling   Asthma    IBS (irritable bowel syndrome)    Irritable bowel syndrome     Patient Active Problem List   Diagnosis Date Noted   Anxiety and depression 10/12/2019   Irritable bowel syndrome 10/16/2012   Allergic rhinitis 10/16/2012    Past Surgical History:  Procedure Laterality Date   MOUTH SURGERY       OB History   No obstetric history on file.     Family History  Problem Relation Age of Onset   Hypertension Father    Diabetes Father    Diabetes Paternal Grandfather     Social  History   Tobacco Use   Smoking status: Never Smoker   Smokeless tobacco: Never Used  Substance Use Topics   Alcohol use: No   Drug use: No    Home Medications Prior to Admission medications   Medication Sig Start Date End Date Taking? Authorizing Provider  albuterol (PROVENTIL HFA;VENTOLIN HFA) 108 (90 Base) MCG/ACT inhaler Inhale 2 puffs into the lungs every 6 (six) hours as needed for wheezing. 05/14/18   Babs Sciara, MD  fexofenadine-pseudoephedrine (ALLEGRA-D 24) 180-240 MG 24 hr tablet Take 1 tablet by mouth daily.    [provider]  Norgestim-Eth Estrad Triphasic (TRI-SPRINTEC PO) Take by mouth.    [provider]  sertraline (ZOLOFT) 100 MG tablet Take 1 tablet (100 mg total) by mouth daily. 10/12/19   Babs Sciara, MD    Allergies    Other  Review of Systems   Review of Systems  Gastrointestinal: Positive for abdominal pain.  All other systems reviewed and are negative.   Physical Exam Updated Vital Signs BP (!) 148/106 (BP Location: Right Arm)    Pulse 87    Temp 98.6 F (37 C) (Oral)    Resp 16    Ht 5\' 6"  (1.676 m)    Wt 95.3 kg    LMP 12/30/2019 (Approximate)    SpO2 100%    BMI 33.89 kg/m   Physical Exam Vitals and nursing note reviewed.  Constitutional:  General: She is not in acute distress.    Appearance: She is well-developed. She is obese.  HENT:     Head: Atraumatic.  Eyes:     Conjunctiva/sclera: Conjunctivae normal.  Cardiovascular:     Rate and Rhythm: Normal rate and regular rhythm.  Pulmonary:     Effort: Pulmonary effort is normal.     Breath sounds: Normal breath sounds.  Abdominal:     General: Abdomen is flat.     Palpations: Abdomen is soft.     Tenderness: There is abdominal tenderness in the suprapubic area. There is guarding. There is no rebound. Negative signs include Murphy's sign, Rovsing's sign and McBurney's sign.  Genitourinary:    Comments: Please refer to pelvic exam  procedure Musculoskeletal:     Cervical back: Neck supple.  Skin:    Findings: No rash.  Neurological:     Mental Status: She is alert.     ED Results / Procedures / Treatments   Labs (all labs ordered are listed, but only abnormal results are displayed) Labs Reviewed  WET PREP, GENITAL - Abnormal; Notable for the following components:      Result Value   WBC, Wet Prep HPF POC MANY (*)    All other components within normal limits  COMPREHENSIVE METABOLIC PANEL - Abnormal; Notable for the following components:   CO2 20 (*)    Total Bilirubin 1.3 (*)    All other components within normal limits  LIPASE, BLOOD  CBC  I-STAT BETA HCG BLOOD, ED ([facility], [facility], [facility] ONLY)  GC/CHLAMYDIA PROBE AMP ([place]) NOT AT [facility]    EKG None  Radiology US PELVIC COMPLETE W TRANSVAGINAL AND TORSION R/O  Result Date: 01/14/2020 CLINICAL DATA:  Initial evaluation for acute pelvic pain. EXAM: TRANSABDOMINAL AND TRANSVAGINAL ULTRASOUND OF PELVIS DOPPLER ULTRASOUND OF OVARIES TECHNIQUE: Both transabdominal and transvaginal ultrasound examinations of the pelvis were performed. Transabdominal technique was performed for global imaging of the pelvis including uterus, ovaries, adnexal regions, and pelvic cul-de-sac. It was necessary to proceed with endovaginal exam following the transabdominal exam to visualize the uterus, endometrium, and ovaries. Color and duplex Doppler ultrasound was utilized to evaluate blood flow to the ovaries. COMPARISON:  Prior CT from 12/11/2014. FINDINGS: Uterus Measurements: 6.3 x 3.0 x 3.6 cm = volume: 36.1 mL. No fibroids or other mass visualized. Multiple scattered nabothian cysts noted clustered about the cervix. Endometrium Thickness: 4 mm.  No focal abnormality visualized. Right ovary Measurements: 3.7 x 2.8 x 2.7 cm = volume: 14.5 mL. 2.2 x 2.1 x 2.3 cm cystic lesion seen arising from the right ovary. Internal focus of mural nodularity measures 5 x 4 x 4 mm. No associated flow  seen within the mural nodular component. There is an adjacent cresenteric complex cystic lesion measuring 2.0 x 1.5 by 1.1 cm. This lesions demonstrates internal lace-like architecture most characteristic of a hemorrhagic cyst. These 2 lesions are separated by a somewhat thickened curvilinear band of soft tissue, which could reflect intervening ovarian tissue or possibly a thickened internal septation. Scattered vascularity seen along this curvilinear band. Left ovary Not visualized.  No adnexal mass. Pulsed Doppler evaluation of the right ovary demonstrates normal low-resistance arterial and venous waveforms. Other findings Trace free fluid noted within the pelvis. IMPRESSION: 1. Two complex cystic lesions arising from the right ovary measuring up to 2.3 cm with internal mural nodularity as above. Curvilinear soft tissue band with internal vascularity could reflect intervening ovarian tissue versus a thickened internal septation. While  these findings are nonspecific, a possible cystic ovarian neoplasm could have this appearance. Gynecologic referral for further evaluation and surgical consultation recommended. Additionally, further assessment with dedicated pelvic MRI, with and without contrast, could be performed for further evaluation as warranted. 2. No evidence for right ovarian torsion. 3. Nonvisualization of the left ovary. No left-sided adnexal mass. 4. Trace free fluid within the pelvis. 5. Normal sonographic appearance of the uterus and endometrium. Electronically Signed   By: Rise Mu M.D.   On: 01/14/2020 21:52    Procedures Pelvic exam  Date/Time: 01/14/2020 8:05 PM Performed by: Fayrene Helper, PA-C Authorized by: Fayrene Helper, PA-C  Local anesthesia used: no  Anesthesia: Local anesthesia used: no  Sedation: Patient sedated: no  Patient tolerance: patient tolerated the procedure well with no immediate complications Comments: Bernita, NT was available to chaperone.  No inguinal  lymphadenopathy or inguinal hernia noted.  This several vesicular lesions surrounding the external genitalia.  Vaginal discharge noted in vaginal vault.  Moderate discomfort with speculum insertion.  Closed cervical os with dystrophic skin changes at the T-zone and friable skin.  On bimanual examination cervical motion tenderness noted without adnexal tenderness.  Examination is limited due to patient discomfort as well as body habitus.    (including critical care time)  Medications Ordered in ED Medications - No data to display  ED Course  I have reviewed the triage vital signs and the nursing notes.  Pertinent labs & imaging results that were available during my care of the patient were reviewed by me and considered in my medical decision making (see chart for details).    MDM Rules/Calculators/A&P                          BP 140/83 (BP Location: Right Arm)    Pulse 86    Temp 98.6 F (37 C) (Oral)    Resp 16    Ht 5\' 6"  (1.676 m)    Wt 95.3 kg    LMP 12/30/2019 (Approximate)    SpO2 98%    BMI 33.89 kg/m   Final Clinical Impression(s) / ED Diagnoses Final diagnoses:  Cyst of right ovary    Rx / DC Orders ED Discharge Orders         Ordered    ibuprofen (ADVIL) 600 MG tablet  Every 6 hours PRN        01/14/20 2206         7:51 PM Patient here with acute onset of suprapubic pain earlier today.  Pain did improve with Toradol given at urgent care center but has not fully resolved.  On exam she does have reproducible suprapubic tenderness.  UA at the urgent care center did not show any obvious signs of UTI.  No blood in the urine.  Her labs are reassuring, pregnancy test is negative, normal lipase, normal electrolytes panel, normal WBC and normal H&H.  In the setting of reproducible pelvic pain, will perform pelvic exam to assess for potential ovarian torsion or TOA.  She does not have significant right lower quadrant tenderness to suggest appendicitis at this time.  8:07  PM Patient with quite a bit of discomfort with pelvic exam along with cervical motion tenderness.  She reports she has not been sexually active in several months and is not concerned of STI.  Will perform pelvic ultrasound to rule out torsion or TOA.  Suspect ruptured ovarian cyst.   9:58 PM Wet prep shows many  WBC.  Patient states she is not sexually active. Transvaginal ultrasound demonstrates 2 complex cystic lesions arising from the right ovary measuring up to 2.3 cm with internal mural nodularity.  Mention that this finding may be nonspecific but a possible cystic ovarian neoplasm could have this appearance.  Radiologist recommend gynecologic referral for further evaluation and surgical consultation recommended.  Additionally further assessments with dedicated pelvic MRI with and without contrast could perform for further evaluation as warranted.  No evidence of ovarian torsion.  Given this finding, I discussed this with Dr. Jacqulyn Bath we felt that this can be evaluated outpatient.  Will give referral to OB/GYN for outpatient evaluation.  Patient discharged home with NSAIDs.    Fayrene Helper, PA-C 01/14/20 2208    Maia Plan, MD 01/17/20 2028

## 2020-01-14 NOTE — Discharge Instructions (Addendum)
You have been evaluated for your pelvic pain. Your ultrasound demonstrate 2 complex cystic lesions arising from the right ovary measuring up to 2.3 cm. Please follow-up closely with your gynecologist and discuss this finding. You may benefit from further dedicated pelvic MRI with and without contrast if warranted. Take ibuprofen as needed for pain. Return to the ED if you have any concern.

## 2020-01-14 NOTE — ED Notes (Signed)
Patient is being discharged from the Urgent Care Center and sent to the Emergency Department via wheelchair by staff. Per Roosvelt Maser, PAC, patient is stable but in need of higher level of care due to right side pelvic pain. Patient is aware and verbalizes understanding of plan of care.  Vitals:   01/14/20 1131 01/14/20 1138  BP:  (!) 134/94  Pulse: 86   Resp: 18   Temp: 98 F (36.7 C)   SpO2: 100%

## 2020-01-14 NOTE — ED Notes (Signed)
Back from ultrasound at this time.

## 2020-01-14 NOTE — ED Notes (Signed)
Received pt from waiting room at this time.

## 2020-01-14 NOTE — ED Notes (Signed)
UA done at urgent care, results in chart.

## 2020-01-14 NOTE — ED Triage Notes (Signed)
Pt in with c/o lower abdominal pain that started this morning. Also c/o some nausea and gas.   Pt has not had medication for sxs  Denies any constipation, diarrhea   Pain upon palpation

## 2020-01-14 NOTE — ED Notes (Signed)
Pt still at ultrasound at this time

## 2020-01-14 NOTE — ED Triage Notes (Signed)
Pt reports sudden onset of lower abd/pelvic pain that began early this am. Denies n/v/d, bleeding or discharge. States she went to urgent care and was given injection of pain meds, sent her for further eval/US. Pt states she is not sexually active at this time.

## 2020-01-14 NOTE — ED Provider Notes (Signed)
[facility]    CSN: [id number] Arrival date & time: 01/14/20  1017      History   Chief Complaint Chief Complaint  Patient presents with  . Abdominal Pain    HPI Sherry Rogers is a 34 y.o. female.   Presenting today with sudden onset severe suprapubic and right pelvic pain with nausea earlier this morning. The area is tender to the touch, made worse with movement, slightly benefits from passing gas. She states it has slacked off in intensity just a bit since onset but still intensely painful. Denies fever, chills, vomiting, diarrhea, dysuria, hematuria, vaginal discharge, rashes, flank pain, concern for pregnancy or STIs (not sexually active for many months and tested several months ago for STIs with neg result). So far has not tried anything for sxs. States she had something similar a few years ago with no identified cause at the time.      Past Medical History:  Diagnosis Date  . Anxiety and depression 10/12/2019   On medication and counseling  . Asthma   . IBS (irritable bowel syndrome)   . Irritable bowel syndrome     Patient Active Problem List   Diagnosis Date Noted  . Anxiety and depression 10/12/2019  . Irritable bowel syndrome 10/16/2012  . Allergic rhinitis 10/16/2012    Past Surgical History:  Procedure Laterality Date  . MOUTH SURGERY      OB History   No obstetric history on file.      Home Medications    Prior to Admission medications   Medication Sig Start Date End Date Taking? Authorizing Provider  sertraline (ZOLOFT) 100 MG tablet Take 1 tablet (100 mg total) by mouth daily. 10/12/19  Yes Luking, Jonna Coup, MD  albuterol (PROVENTIL HFA;VENTOLIN HFA) 108 (90 Base) MCG/ACT inhaler Inhale 2 puffs into the lungs every 6 (six) hours as needed for wheezing. 05/14/18   Babs Sciara, MD  fexofenadine-pseudoephedrine (ALLEGRA-D 24) 180-240 MG 24 hr tablet Take 1 tablet by mouth daily.    [provider]  Norgestim-Eth Estrad  Triphasic (TRI-SPRINTEC PO) Take by mouth.    [provider]    Family History Family History  Problem Relation Age of Onset  . Hypertension Father   . Diabetes Father   . Diabetes Paternal Grandfather     Social History Social History   Tobacco Use  . Smoking status: Never Smoker  . Smokeless tobacco: Never Used  Substance Use Topics  . Alcohol use: No  . Drug use: No     Allergies   Other   Review of Systems Review of Systems PER HPI    Physical Exam Triage Vital Signs ED Triage Vitals  Enc Vitals Group     BP 01/14/20 1138 (!) 134/94     Pulse Rate 01/14/20 1131 86     Resp 01/14/20 1131 18     Temp 01/14/20 1131 98 F (36.7 C)     Temp Source 01/14/20 1131 Oral     SpO2 01/14/20 1131 100 %     Weight --      Height --      Head Circumference --      Peak Flow --      Pain Score 01/14/20 1127 3     Pain Loc --      Pain Edu? --      Excl. in GC? --    No data found.  Updated Vital Signs BP (!) 134/94 (BP Location: Right  Arm)   Pulse 86   Temp 98 F (36.7 C) (Oral)   Resp 18   LMP 12/30/2019 (Approximate)   SpO2 100%   Visual Acuity Right Eye Distance:   Left Eye Distance:   Bilateral Distance:    Right Eye Near:   Left Eye Near:    Bilateral Near:     Physical Exam Vitals and nursing note reviewed.  Constitutional:      Appearance: Normal appearance. She is well-developed.     Comments: Appears very uncomfortable, particularly with movement  HENT:     Head: Atraumatic.     Mouth/Throat:     Mouth: Mucous membranes are moist.     Pharynx: Oropharynx is clear.  Eyes:     Extraocular Movements: Extraocular movements intact.     Conjunctiva/sclera: Conjunctivae normal.  Cardiovascular:     Rate and Rhythm: Normal rate and regular rhythm.     Heart sounds: Normal heart sounds.  Pulmonary:     Effort: Pulmonary effort is normal.     Breath sounds: Normal breath sounds. No wheezing.  Abdominal:     General: Bowel  sounds are normal. There is no distension.     Palpations: Abdomen is soft. There is no mass.     Tenderness: There is abdominal tenderness (severe ttp diffusely across lower abdomen, notably worse suprapubic and RLQ/pelvic area). There is guarding. There is no right CVA tenderness, left CVA tenderness or rebound.  Genitourinary:    Comments: GU exam declined Musculoskeletal:        General: Normal range of motion.     Cervical back: Normal range of motion and neck supple.  Skin:    General: Skin is warm and dry.  Neurological:     Mental Status: She is alert and oriented to person, place, and time.  Psychiatric:        Mood and Affect: Mood normal.        Thought Content: Thought content normal.        Judgment: Judgment normal.      UC Treatments / Results  Labs (all labs ordered are listed, but only abnormal results are displayed) Labs Reviewed  POCT URINALYSIS DIPSTICK, ED / UC - Abnormal; Notable for the following components:      Result Value   Leukocytes,Ua SMALL (*)    All other components within normal limits    EKG   Radiology No results found.  Procedures Procedures (including critical care time)  Medications Ordered in UC Medications  ketorolac (TORADOL) injection 60 mg (60 mg Intramuscular Given 01/14/20 1206)    Initial Impression / Assessment and Plan / UC Course  I have reviewed the triage vital signs and the nursing notes.  Pertinent labs & imaging results that were available during my care of the patient were reviewed by me and considered in my medical decision making (see chart for details).     Discussed at length with patient that given severity and acute onset nature of her pain, recommendation at this time is imaging and further workup in the ED. She initially wanted to start a workup in this setting so labs ordered but she in the end decided to go to ED for further workup so labs were not performed. U/A unrevealing, small amount of leuks  otherwise normal. She did feel nearly instant benefit from the IM toradol administered. Hemodynamically stable at this time, declines EMS and chooses to have her ride take her to the ED. Report called to  [facility].   Final Clinical Impressions(s) / UC Diagnoses   Final diagnoses:  Pelvic pain  Nausea   Discharge Instructions   None    ED Prescriptions    None     PDMP not reviewed this encounter.   Particia Nearing, [place] 01/14/20 1353

## 2020-01-17 ENCOUNTER — Encounter: Payer: Self-pay | Admitting: Family Medicine

## 2020-01-17 LAB — GC/CHLAMYDIA PROBE AMP (~~LOC~~) NOT AT ARMC
Chlamydia: NEGATIVE
Comment: NEGATIVE
Comment: NORMAL
Neisseria Gonorrhea: NEGATIVE

## 2020-01-18 DIAGNOSIS — N83209 Unspecified ovarian cyst, unspecified side: Secondary | ICD-10-CM | POA: Diagnosis not present

## 2020-02-21 DIAGNOSIS — N83201 Unspecified ovarian cyst, right side: Secondary | ICD-10-CM | POA: Diagnosis not present

## 2020-03-01 ENCOUNTER — Ambulatory Visit (INDEPENDENT_AMBULATORY_CARE_PROVIDER_SITE_OTHER): Payer: Worker's Compensation

## 2020-03-01 ENCOUNTER — Ambulatory Visit
Admission: EM | Admit: 2020-03-01 | Discharge: 2020-03-01 | Disposition: A | Discharge status: Home / Self Care | Payer: Worker's Compensation | Attending: Emergency Medicine | Admitting: Emergency Medicine

## 2020-03-01 ENCOUNTER — Other Ambulatory Visit: Payer: Self-pay

## 2020-03-01 ENCOUNTER — Encounter: Payer: Self-pay | Admitting: Emergency Medicine

## 2020-03-01 DIAGNOSIS — X501XXA Overexertion from prolonged static or awkward postures, initial encounter: Secondary | ICD-10-CM | POA: Diagnosis not present

## 2020-03-01 DIAGNOSIS — W19XXXA Unspecified fall, initial encounter: Secondary | ICD-10-CM

## 2020-03-01 DIAGNOSIS — S99912A Unspecified injury of left ankle, initial encounter: Secondary | ICD-10-CM | POA: Diagnosis not present

## 2020-03-01 DIAGNOSIS — M25572 Pain in left ankle and joints of left foot: Secondary | ICD-10-CM | POA: Diagnosis not present

## 2020-03-01 DIAGNOSIS — M25561 Pain in right knee: Secondary | ICD-10-CM | POA: Diagnosis not present

## 2020-03-01 MED ORDER — IBUPROFEN 800 MG PO TABS: 800.0000 mg | ORAL_TABLET | Freq: Three times a day (TID) | ORAL | 0 refills | Status: AC

## 2020-03-01 NOTE — ED Provider Notes (Signed)
[facility]   [id number] 03/01/20 Arrival Time: 0807   Chief Complaint  Patient presents with  . Fall     SUBJECTIVE: History from: patient and family.  Sherry Rogers is a 35 y.o. female who presented to the urgent care for complaint of fall that occurred  last night.  She is reporting right knee pain and left foot/ankle pain.  She states she was walking and mistakenly hit the curb.  Localized pain to the right knee and left ankle/foot pain.  She describes the pain as constant and achy.  She has tried OTC medications without relief.  Her symptoms are made worse with ROM.  She denies similar symptoms in the past.  Denies chills, fever, nausea, vomiting, diarrhea   ROS: As per HPI.  All other pertinent ROS negative.     Past Medical History:  Diagnosis Date  . Anxiety and depression 10/12/2019   On medication and counseling  . Asthma   . IBS (irritable bowel syndrome)   . Irritable bowel syndrome    Past Surgical History:  Procedure Laterality Date  . MOUTH SURGERY     Allergies  Allergen Reactions  . Other     Tdap vaccine.  covid vaccine.   No current facility-administered medications on file prior to encounter.   Current Outpatient Medications on File Prior to Encounter  Medication Sig Dispense Refill  . albuterol (PROVENTIL HFA;VENTOLIN HFA) 108 (90 Base) MCG/ACT inhaler Inhale 2 puffs into the lungs every 6 (six) hours as needed for wheezing. 1 Inhaler 2  . fexofenadine-pseudoephedrine (ALLEGRA-D 24) 180-240 MG 24 hr tablet Take 1 tablet by mouth daily.    Lorita Officer Triphasic (TRI-SPRINTEC PO) Take by mouth.    . sertraline (ZOLOFT) 100 MG tablet Take 1 tablet (100 mg total) by mouth daily. 90 tablet 1   Social History   Socioeconomic History  . Marital status: Legally Separated    Spouse name: Not on file  . Number of children: Not on file  . Years of education: Not on file  . Highest education level: Not on file  Occupational  History  . Not on file  Tobacco Use  . Smoking status: Never Smoker  . Smokeless tobacco: Never Used  Substance and Sexual Activity  . Alcohol use: No  . Drug use: No  . Sexual activity: Not on file  Other Topics Concern  . Not on file  Social History Narrative  . Not on file   Social Determinants of Health   Financial Resource Strain: Not on file  Food Insecurity: Not on file  Transportation Needs: Not on file  Physical Activity: Not on file  Stress: Not on file  Social Connections: Not on file  Intimate Partner Violence: Not on file   Family History  Problem Relation Age of Onset  . Hypertension Father   . Diabetes Father   . Diabetes Paternal Grandfather     OBJECTIVE:  Vitals:   03/01/20 0826 03/01/20 0827  BP: (!) 127/91   Pulse: 98   Resp: 20   Temp: 98.7 F (37.1 C)   TempSrc: Oral   SpO2: 97%   Weight:  210 lb (95.3 kg)  Height:  5\' 6"  (1.676 m)     Physical Exam Vitals and nursing note reviewed.  Constitutional:      General: She is not in acute distress.    Appearance: Normal appearance. She is normal weight. She is not ill-appearing, toxic-appearing or diaphoretic.  HENT:  Head: Normocephalic.  Cardiovascular:     Rate and Rhythm: Normal rate and regular rhythm.     Pulses: Normal pulses.     Heart sounds: Normal heart sounds. No murmur heard. No friction rub. No gallop.   Pulmonary:     Effort: Pulmonary effort is normal. No respiratory distress.     Breath sounds: Normal breath sounds. No stridor. No wheezing, rhonchi or rales.  Chest:     Chest wall: No tenderness.  Musculoskeletal:        General: Tenderness present.     Right knee: Tenderness present.     Left ankle: No swelling. Tenderness present.     Comments: Left ankle/foot is without any obvious asymmetry or deformity when compared to the right ankle/foot.  There is no ecchymosis, open wound, lesion or warmth present.  Normal range of motion with pain.  Neurovascular status  intact.  The right knee is without any obvious asymmetry or deformity compared to the left knee.  Ecchymosis was present.  There is no swelling, open wound, lesion, warmth present.  No pain with massage of calf muscle.  Limited range of motion with pain.  Neurovascular status intact.  Neurological:     Mental Status: She is alert and oriented to person, place, and time.    LABS:  No results found for this or any previous visit (from the past 24 hour(s)).   RADIOLOGY:  DG Ankle Complete Left  Result Date: 03/01/2020 CLINICAL DATA:  Left ankle pain status post fall and twisting injury. EXAM: LEFT ANKLE COMPLETE - 3+ VIEW COMPARISON:  None. FINDINGS: There is no evidence of fracture, dislocation, or joint effusion. There is no evidence of arthropathy or other focal bone abnormality. Soft tissues are unremarkable. IMPRESSION: No acute abnormality of the left ankle. Electronically Signed   By: Acquanetta Belling M.D.   On: 03/01/2020 09:04   Left ankle x-ray is negative for bony abnormality including fracture or dislocation.  I have reviewed the x-ray myself and the radiologist interpretation.  I am in agreement with the radiologist interpretation.  ASSESSMENT & PLAN:  1. Fall, initial encounter   2. Acute pain of right knee   3. Acute left ankle pain     Meds ordered this encounter  Medications  . ibuprofen (ADVIL) 800 MG tablet    Sig: Take 1 tablet (800 mg total) by mouth 3 (three) times daily.    Dispense:  30 tablet    Refill:  0    Discharge instructions  Ibuprofen was prescribed for pain/take as prescribed and with food Follow-up with PCP/orthopedic Follow rice instruction that is attached Return or go to ED if you develop any new or worsening of his symptoms  Reviewed expectations re: course of current medical issues. Questions answered. Outlined signs and symptoms indicating need for more acute intervention. Patient verbalized understanding. After Visit Summary  given.         Durward Parcel, FNP 03/01/20 [phone]

## 2020-03-01 NOTE — Discharge Instructions (Addendum)
  Ibuprofen was prescribed for pain/take as prescribed and with food Follow-up with PCP/orthopedic Follow rice instruction that is attached Return or go to ED if you develop any new or worsening of his symptoms

## 2020-03-01 NOTE — ED Triage Notes (Signed)
Tipped over a curb yesterday.  Right knee pain, right big toe, and left foot pain

## 2020-04-03 DIAGNOSIS — S99912A Unspecified injury of left ankle, initial encounter: Secondary | ICD-10-CM | POA: Diagnosis not present

## 2020-04-03 DIAGNOSIS — S93409A Sprain of unspecified ligament of unspecified ankle, initial encounter: Secondary | ICD-10-CM | POA: Diagnosis not present

## 2020-04-03 DIAGNOSIS — S96919A Strain of unspecified muscle and tendon at ankle and foot level, unspecified foot, initial encounter: Secondary | ICD-10-CM | POA: Diagnosis not present

## 2020-05-15 ENCOUNTER — Ambulatory Visit: Payer: BC Managed Care – PPO | Admitting: Family Medicine

## 2020-05-24 ENCOUNTER — Other Ambulatory Visit: Payer: Self-pay | Admitting: Family Medicine

## 2020-05-24 NOTE — Telephone Encounter (Signed)
May have 90-day needs to do a follow-up

## 2020-08-27 ENCOUNTER — Other Ambulatory Visit: Payer: Self-pay | Admitting: Family Medicine

## 2021-01-01 DIAGNOSIS — Z01419 Encounter for gynecological examination (general) (routine) without abnormal findings: Secondary | ICD-10-CM | POA: Diagnosis not present

## 2021-01-01 DIAGNOSIS — R61 Generalized hyperhidrosis: Secondary | ICD-10-CM | POA: Diagnosis not present

## 2021-01-01 DIAGNOSIS — Z6837 Body mass index (BMI) 37.0-37.9, adult: Secondary | ICD-10-CM | POA: Diagnosis not present

## 2021-01-01 DIAGNOSIS — R5383 Other fatigue: Secondary | ICD-10-CM | POA: Diagnosis not present

## 2021-04-13 IMAGING — US US PELVIS COMPLETE TRANSABD/TRANSVAG W DUPLEX
1 series · 13 of 25 positions shown · non-contrast
Comparison: Prior CT from 12/11/2014.

CLINICAL DATA: Initial evaluation for acute pelvic pain.

EXAM:
TRANSABDOMINAL AND TRANSVAGINAL ULTRASOUND OF PELVIS
DOPPLER ULTRASOUND OF OVARIES
TECHNIQUE: Both transabdominal and transvaginal ultrasound examinations of the
pelvis were performed. Transabdominal technique was performed for
global imaging of the pelvis including uterus, ovaries, adnexal
regions, and pelvic cul-de-sac.
It was necessary to proceed with endovaginal exam following the
transabdominal exam to visualize the uterus, endometrium, and
ovaries. Color and duplex Doppler ultrasound was utilized to
evaluate blood flow to the ovaries.

[Series 1: us pelvic complete w transvaginal and torsion righ · 109 acquisitions, 13 frames shown]
[im 1/109]
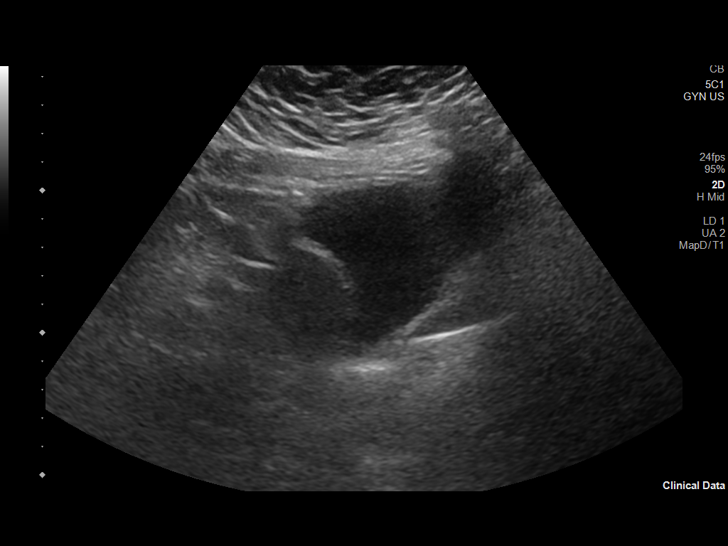
[im 10/109]
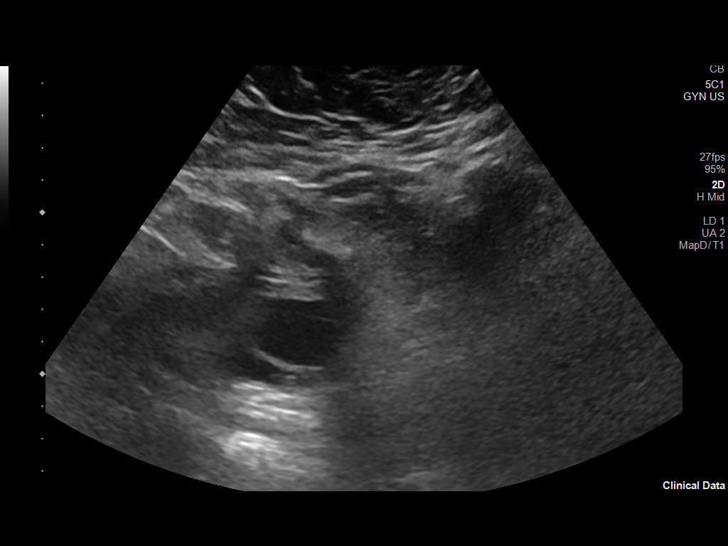
[im 19/109]
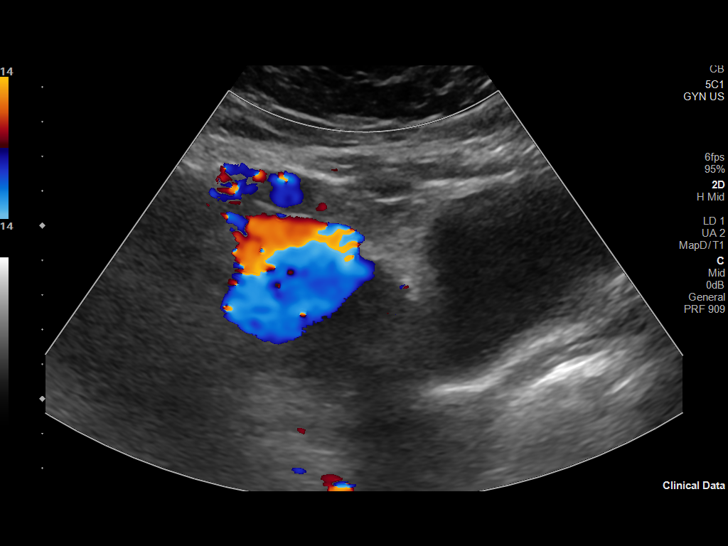
[im 28/109]
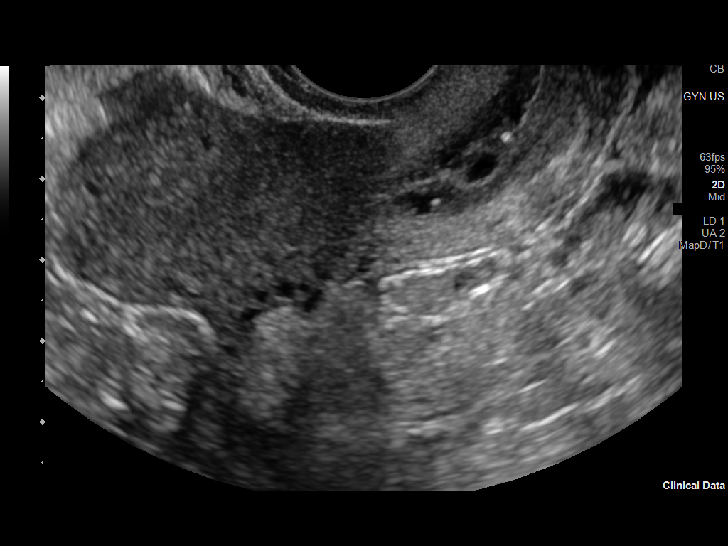
[im 37/109]
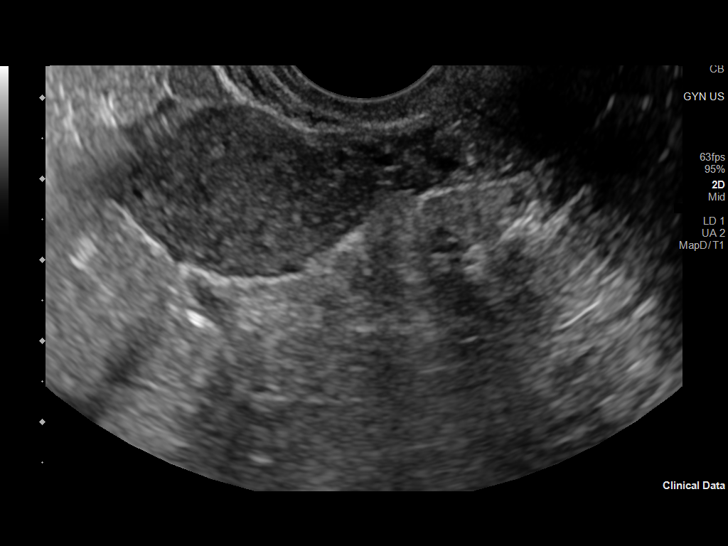
[im 46/109]
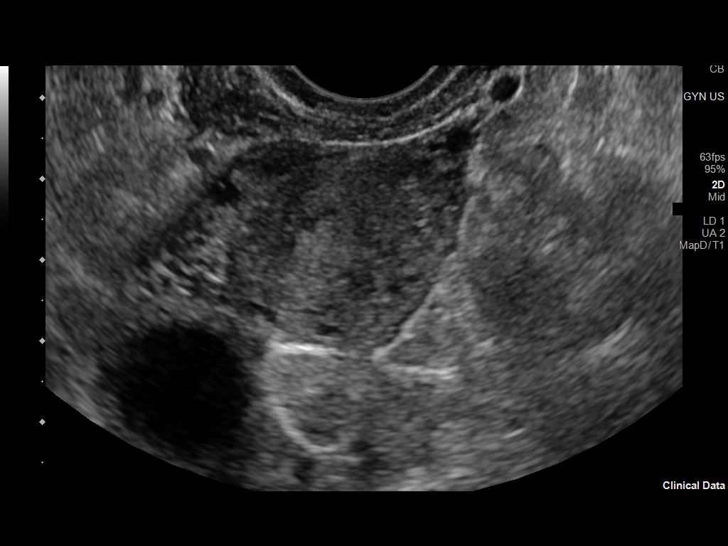
[im 55/109]
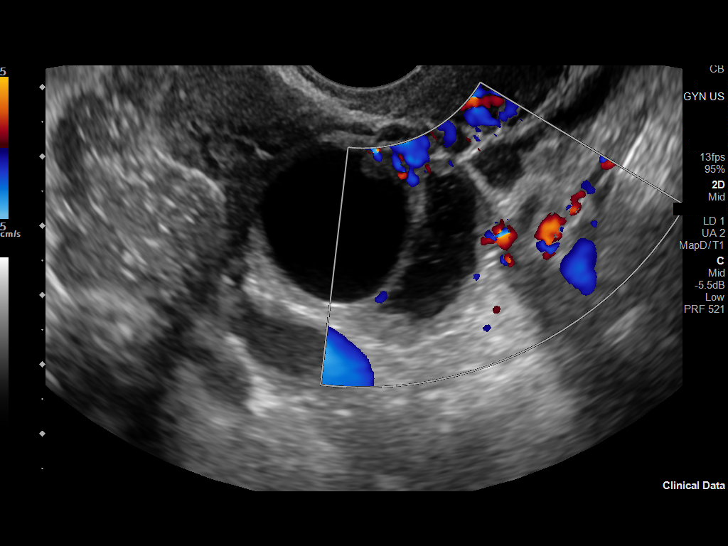
[im 64/109]
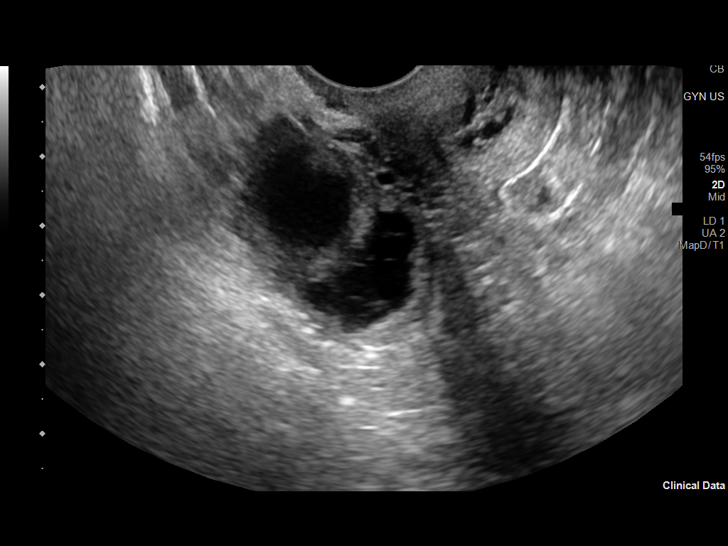
[im 73/109]
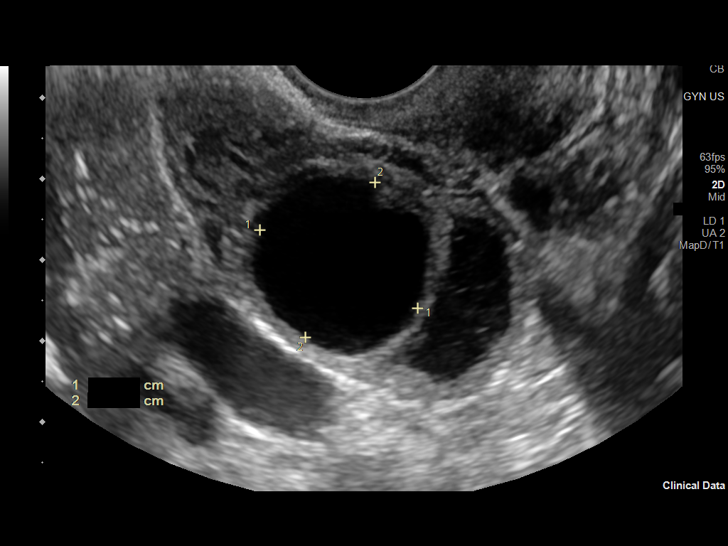
[im 82/109]
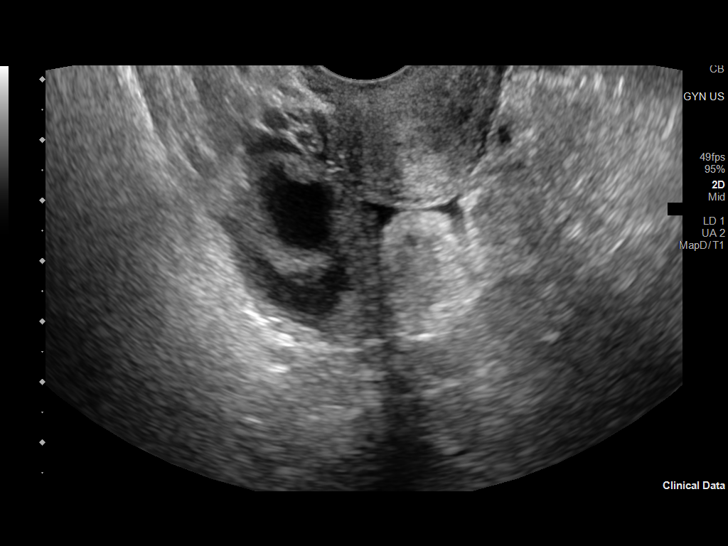
[im 91/109]
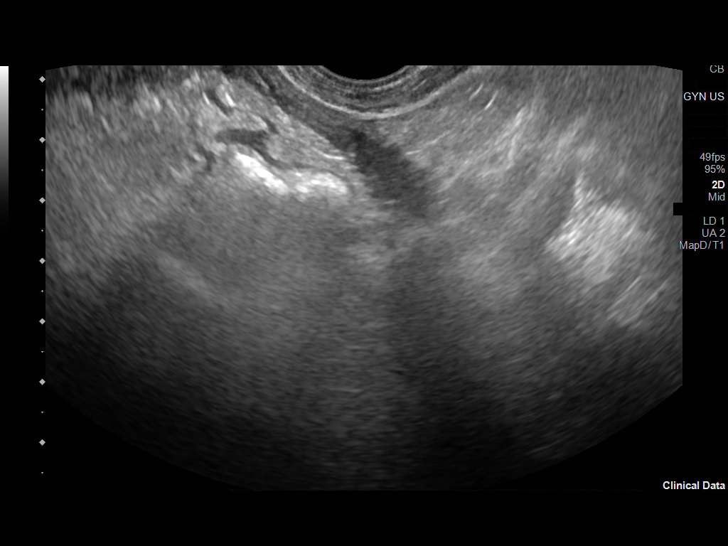
[im 100/109]
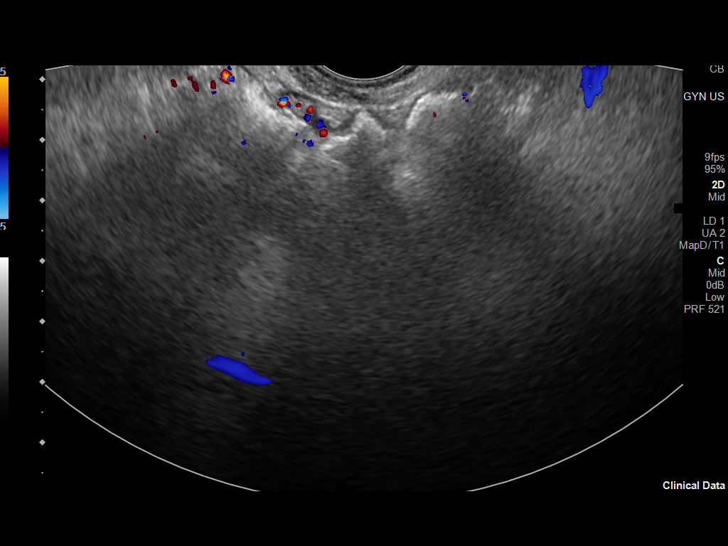
[im 109/109]
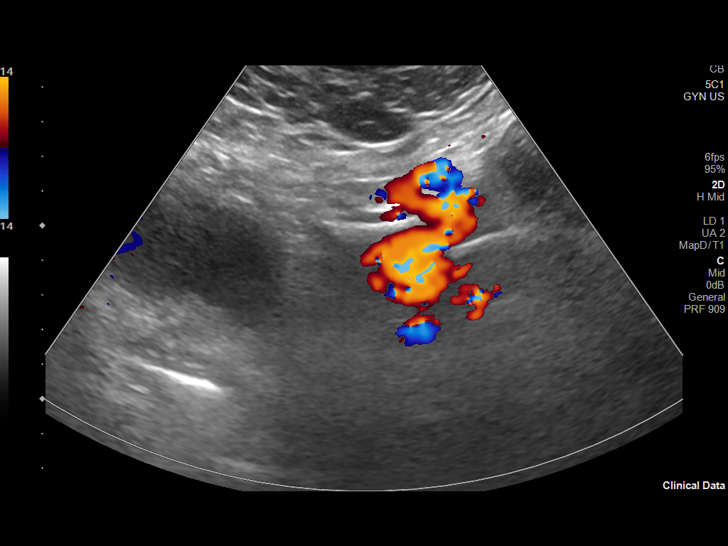

[13 of 25 positions shown; findings below may reference images not displayed]

FINDINGS: Uterus

Measurements: 6.3 x 3.0 x 3.6 cm = volume: 36.1 mL. No fibroids or
other mass visualized. Multiple scattered nabothian cysts noted
clustered about the cervix.

Endometrium

Thickness: 4 mm.  No focal abnormality visualized.

Right ovary

Measurements: 3.7 x 2.8 x 2.7 cm = volume: 14.5 mL. 2.2 x 2.1 x
cm cystic lesion seen arising from the right ovary. Internal focus
of mural nodularity measures 5 x 4 x 4 mm. No associated flow seen
within the mural nodular component. There is an adjacent cresenteric
complex cystic lesion measuring 2.0 x 1.5 by 1.1 cm. This lesions
demonstrates internal lace-like architecture most characteristic of
a hemorrhagic cyst. These 2 lesions are separated by a somewhat
thickened curvilinear band of soft tissue, which could reflect
intervening ovarian tissue or possibly a thickened internal
septation. Scattered vascularity seen along this curvilinear band.

Left ovary

Not visualized.  No adnexal mass.

Pulsed Doppler evaluation of the right ovary demonstrates normal
low-resistance arterial and venous waveforms.

Other findings

Trace free fluid noted within the pelvis.
IMPRESSION: 1. Two complex cystic lesions arising from the right ovary measuring
up to 2.3 cm with internal mural nodularity as above. Curvilinear
soft tissue band with internal vascularity could reflect intervening
ovarian tissue versus a thickened internal septation. While these
findings are nonspecific, a possible cystic ovarian neoplasm could
have this appearance. Gynecologic referral for further evaluation
and surgical consultation recommended. Additionally, further
assessment with dedicated pelvic MRI, with and without contrast,
could be performed for further evaluation as warranted.
2. No evidence for right ovarian torsion.
3. Nonvisualization of the left ovary. No left-sided adnexal mass.
4. Trace free fluid within the pelvis.
5. Normal sonographic appearance of the uterus and endometrium.

## 2021-11-12 ENCOUNTER — Other Ambulatory Visit: Payer: Self-pay

## 2021-11-12 DIAGNOSIS — Z3041 Encounter for surveillance of contraceptive pills: Secondary | ICD-10-CM

## 2021-11-12 NOTE — Telephone Encounter (Signed)
Last AEX w/ [facility]--has not scheduled any appts with [facility].  Called pt to confirm if she plans to follow JC to [facility] or to stay @ [facility].   No answer, LVMTCB.

## 2021-11-13 NOTE — Telephone Encounter (Signed)
I spoke with patient. She said that [facility] went ahead and filled this unaware that she had access to [facility]. She said she has enough refills to take her until Nov. She has moved to [place], [place] and plans to find Gyn there. Will not be returning to  [facility].

## 2021-11-19 ENCOUNTER — Other Ambulatory Visit: Payer: Self-pay

## 2021-11-19 NOTE — Telephone Encounter (Signed)
Per refill encounter on 11/12/21-pt has moved to [place] and plans to find new GYN provider there. Currently has enough OCPs until 12/2021.

## 2021-11-21 ENCOUNTER — Telehealth: Payer: Self-pay

## 2021-11-21 NOTE — Telephone Encounter (Signed)
Refill request received from [facility] for [place]/[facility] ES Triphasic [address] Phone [phone] Fax [phone] Last office visit was at [facility] 01/01/21.  Patient has not been seen at [facility].  [facility] was contacted by phone and notified of above.  Representative will contact [facility] for refill. [place] Phone [phone] Fax [phone].

## 2022-01-16 ENCOUNTER — Other Ambulatory Visit: Payer: Self-pay

## 2022-01-16 NOTE — Telephone Encounter (Signed)
Pt has not been seen by JC since she was @ [facility] in 12/2020--there is nothing on schedule for pt to be seen here at [facility].   Spoke with pt and she reported that she is now living in [place] and is seeing a new provider for this medication. Will refuse refill request and note.      

## 2022-01-28 ENCOUNTER — Other Ambulatory Visit: Payer: Self-pay

## 2022-01-28 NOTE — Telephone Encounter (Signed)
Pt has not been seen by JC since she was @ [facility] in 12/2020--there is nothing on schedule for pt to be seen here at [facility].   Spoke with pt and she reported that she is now living in [place] and is seeing a new provider for this medication. Will refuse refill request and note.
